# Patient Record
Sex: Female | Born: 1990 | Race: White | Hispanic: No | Marital: Single | Smoking: Former smoker
Health system: Southern US, Community
[De-identification: ages and names within clinical notes are randomized; demographics above are authoritative.]

## PROBLEM LIST (undated history)

## (undated) DIAGNOSIS — J45909 Unspecified asthma, uncomplicated: Secondary | ICD-10-CM

## (undated) DIAGNOSIS — F419 Anxiety disorder, unspecified: Secondary | ICD-10-CM

## (undated) DIAGNOSIS — E039 Hypothyroidism, unspecified: Secondary | ICD-10-CM

## (undated) DIAGNOSIS — Z789 Other specified health status: Secondary | ICD-10-CM

## (undated) DIAGNOSIS — Z8709 Personal history of other diseases of the respiratory system: Secondary | ICD-10-CM

## (undated) HISTORY — PX: OTHER SURGICAL HISTORY: SHX169

## (undated) HISTORY — DX: Personal history of other diseases of the respiratory system: Z87.09

## (undated) HISTORY — DX: Unspecified asthma, uncomplicated: J45.909

## (undated) HISTORY — DX: Anxiety disorder, unspecified: F41.9

## (undated) HISTORY — PX: TONSILLECTOMY: SUR1361

---
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
## 2013-01-10 ENCOUNTER — Emergency Department: Payer: Self-pay | Admitting: Emergency Medicine

## 2013-09-28 ENCOUNTER — Emergency Department: Payer: Self-pay

## 2015-04-05 ENCOUNTER — Emergency Department: Payer: Medicaid Other

## 2015-04-05 ENCOUNTER — Encounter: Payer: Self-pay | Admitting: Emergency Medicine

## 2015-04-05 ENCOUNTER — Emergency Department
Admission: EM | Admit: 2015-04-05 | Discharge: 2015-04-05 | Disposition: A | Discharge status: Home / Self Care | Payer: Medicaid Other | Attending: Emergency Medicine | Admitting: Emergency Medicine

## 2015-04-05 DIAGNOSIS — O209 Hemorrhage in early pregnancy, unspecified: Secondary | ICD-10-CM

## 2015-04-05 DIAGNOSIS — Z3A08 8 weeks gestation of pregnancy: Secondary | ICD-10-CM | POA: Diagnosis not present

## 2015-04-05 DIAGNOSIS — N939 Abnormal uterine and vaginal bleeding, unspecified: Secondary | ICD-10-CM

## 2015-04-05 DIAGNOSIS — Z87891 Personal history of nicotine dependence: Secondary | ICD-10-CM | POA: Diagnosis not present

## 2015-04-05 LAB — CBC WITH DIFFERENTIAL/PLATELET
BASOS PCT: 0 %
Basophils Absolute: 0 10*3/uL (ref 0–0.1)
EOS ABS: 0.1 10*3/uL (ref 0–0.7)
Eosinophils Relative: 1 %
HEMATOCRIT: 43.5 % (ref 35.0–47.0)
HEMOGLOBIN: 14.2 g/dL (ref 12.0–16.0)
Lymphocytes Relative: 25 %
Lymphs Abs: 2 10*3/uL (ref 1.0–3.6)
MCH: 28.9 pg (ref 26.0–34.0)
MCHC: 32.6 g/dL (ref 32.0–36.0)
MCV: 88.6 fL (ref 80.0–100.0)
MONO ABS: 0.5 10*3/uL (ref 0.2–0.9)
MONOS PCT: 7 %
Neutro Abs: 5.1 10*3/uL (ref 1.4–6.5)
Neutrophils Relative %: 67 %
Platelets: 190 10*3/uL (ref 150–440)
RBC: 4.91 MIL/uL (ref 3.80–5.20)
RDW: 12.8 % (ref 11.5–14.5)
WBC: 7.7 10*3/uL (ref 3.6–11.0)

## 2015-04-05 LAB — COMPREHENSIVE METABOLIC PANEL
ALK PHOS: 90 U/L (ref 38–126)
ALT: 9 U/L — AB (ref 14–54)
ANION GAP: 5 (ref 5–15)
AST: 16 U/L (ref 15–41)
Albumin: 4.2 g/dL (ref 3.5–5.0)
BUN: 7 mg/dL (ref 6–20)
CHLORIDE: 108 mmol/L (ref 101–111)
CO2: 24 mmol/L (ref 22–32)
Calcium: 9.4 mg/dL (ref 8.9–10.3)
Creatinine, Ser: 0.68 mg/dL (ref 0.44–1.00)
GFR calc Af Amer: >60 mL/min (ref >60)
GFR calc non Af Amer: >60 mL/min (ref >60)
Glucose, Bld: 92 mg/dL (ref 65–99)
POTASSIUM: 3.7 mmol/L (ref 3.5–5.1)
Sodium: 137 mmol/L (ref 135–145)
Total Bilirubin: 0.6 mg/dL (ref 0.3–1.2)
Total Protein: 7 g/dL (ref 6.5–8.1)

## 2015-04-05 LAB — ABO/RH
ABO/RH(D): B POS
ABO/RH(D): B POS

## 2015-04-05 LAB — HCG, QUANTITATIVE, PREGNANCY: HCG, BETA CHAIN, QUANT, S: 30 m[IU]/mL — AB (ref <5)

## 2015-04-05 NOTE — ED Notes (Signed)
Patient transported to Ultrasound 

## 2015-04-05 NOTE — ED Provider Notes (Signed)
[facility] Emergency Department Provider Note  ____________________________________________  Time seen: 3:50 PM  I have reviewed the triage vital signs and the nursing notes.   HISTORY  Chief Complaint Vaginal Bleeding    HPI Joan Moreno is a 24 y.o. female who reports being [redacted] weeks pregnant with her first pregnancy and seeing some vaginal bleeding today. She notes that the bleeding is a little bit heavier than spotting but not quite as heavy as a regular period. No chest pain shortness of breath abdominal pain nausea vomiting diarrhea headaches vision changes or dizziness. No abnormal vaginal discharge or fever. She notes mild pelvic cramping  She recently went to the [facility] health Department to establish prenatal care and is taking vitamins.   History reviewed. No pertinent past medical history.  There are no active problems to display for this patient.   Past Surgical History  Procedure Laterality Date  . Tonsillectomy      No current outpatient prescriptions on file.  Allergies Sulfa antibiotics  No family history on file.  Social History History  Substance Use Topics  . Smoking status: Former Games developer  . Smokeless tobacco: Not on file  . Alcohol Use: No    Review of Systems  Constitutional: No fever or chills. No weight changes Eyes:No blurry vision or double vision.  ENT: No sore throat. Cardiovascular: No chest pain. Respiratory: No dyspnea or cough. Gastrointestinal: Negative for abdominal pain, vomiting and diarrhea.  No BRBPR or melena. Genitourinary: Vaginal bleeding as above  Musculoskeletal: Negative for back pain. No joint swelling or pain. Skin: Negative for rash. Neurological: Negative for headaches, focal weakness or numbness. Psychiatric:No anxiety or depression.   Endocrine:No hot/cold intolerance, changes in energy, or sleep difficulty.  10-point ROS otherwise  negative.  ____________________________________________   PHYSICAL EXAM:  VITAL SIGNS: ED Triage Vitals  Enc Vitals Group     BP 04/05/15 1306 124/82 mmHg     Pulse Rate 04/05/15 1306 100     Resp 04/05/15 1306 20     Temp 04/05/15 1306 98.3 F (36.8 C)     Temp Source 04/05/15 1306 Oral     SpO2 04/05/15 1306 98 %     Weight 04/05/15 1306 120 lb (54.432 kg)     Height 04/05/15 1306 5\' 5"  (1.651 m)     Head Cir --      Peak Flow --      Pain Score 04/05/15 1308 2     Pain Loc --      Pain Edu? --      Excl. in GC? --      Constitutional: Alert and oriented. Well appearing and in no distress. Eyes: No scleral icterus. No conjunctival pallor. PERRL. EOMI ENT   Head: Normocephalic and atraumatic.   Nose: No congestion/rhinnorhea. No septal hematoma   Mouth/Throat: MMM, no pharyngeal erythema. No peritonsillar mass. No uvula shift.   Neck: No stridor. No SubQ emphysema. No meningismus. Hematological/Lymphatic/Immunilogical: No cervical lymphadenopathy. Cardiovascular: RRR. Normal and symmetric distal pulses are present in all extremities. No murmurs, rubs, or gallops. Respiratory: Normal respiratory effort without tachypnea nor retractions. Breath sounds are clear and equal bilaterally. No wheezes/rales/rhonchi. Gastrointestinal: Soft and nontender. No distention. There is no CVA tenderness.  No rebound, rigidity, or guarding. Genitourinary: deferred Musculoskeletal: Nontender with normal range of motion in all extremities. No joint effusions.  No lower extremity tenderness.  No edema. Neurologic:   Normal speech and language.  CN 2-10 normal. Motor grossly  intact. No pronator drift.  Normal gait. No gross focal neurologic deficits are appreciated.  Skin:  Skin is warm, dry and intact. No rash noted.  No petechiae, purpura, or bullae. Psychiatric: Mood and affect are normal. Speech and behavior are normal. Patient exhibits appropriate insight and  judgment.  ____________________________________________    LABS (pertinent positives/negatives) (all labs ordered are listed, but only abnormal results are displayed) Labs Reviewed  COMPREHENSIVE METABOLIC PANEL - Abnormal; Notable for the following:    ALT 9 (*)    All other components within normal limits  HCG, QUANTITATIVE, PREGNANCY - Abnormal; Notable for the following:    hCG, Beta Chain, Quant, S 30 (*)    All other components within normal limits  CBC WITH DIFFERENTIAL/PLATELET  ABO/RH  ABO/RH   ____________________________________________   EKG    ____________________________________________    RADIOLOGY  Pelvic ultrasound unable to visualize pregnancy inside the uterus or any ectopic location, otherwise unremarkable.  ____________________________________________   PROCEDURES  ____________________________________________   INITIAL IMPRESSION / ASSESSMENT AND PLAN / ED COURSE  Pertinent labs & imaging results that were available during my care of the patient were reviewed by me and considered in my medical decision making (see chart for details).  Ultrasound unable to confirm location of pregnancy. HCG is 30, so this may be because it is too early or because she has completed a miscarriage. We'll have her follow-up with the health Department for continued monitoring of her serum hCG and ultrasound. She'll return if she has any worsening symptoms including chest pain shortness of breath worsened bleeding or abdominal pain. Rh+, so no RhoGAM  ____________________________________________   FINAL CLINICAL IMPRESSION(S) / ED DIAGNOSES  Final diagnoses:  Vagina bleeding  Vaginal bleeding in pregnancy, first trimester      Sharman Cheek, MD 04/05/15 [phone]

## 2015-04-05 NOTE — ED Notes (Signed)
Has not had ultrasound for this pregnancy yet

## 2015-04-05 NOTE — Discharge Instructions (Signed)
Vaginal Bleeding During Pregnancy, First Trimester A small amount of bleeding (spotting) from the vagina is relatively common in early pregnancy. It usually stops on its own. Various things may cause bleeding or spotting in early pregnancy. Some bleeding may be related to the pregnancy, and some may not. In most cases, the bleeding is normal and is not a problem. However, bleeding can also be a sign of something serious. Be sure to tell your health care provider about any vaginal bleeding right away. Some possible causes of vaginal bleeding during the first trimester include:  Infection or inflammation of the cervix.  Growths (polyps) on the cervix.  Miscarriage or threatened miscarriage.  Pregnancy tissue has developed outside of the uterus and in a fallopian tube (tubal pregnancy).  Tiny cysts have developed in the uterus instead of pregnancy tissue (molar pregnancy). HOME CARE INSTRUCTIONS  Watch your condition for any changes. The following actions may help to lessen any discomfort you are feeling:  Follow your health care provider's instructions for limiting your activity. If your health care provider orders bed rest, you may need to stay in bed and only get up to use the bathroom. However, your health care provider may allow you to continue light activity.  If needed, make plans for someone to help with your regular activities and responsibilities while you are on bed rest.  Keep track of the number of pads you use each day, how often you change pads, and how soaked (saturated) they are. Write this down.  Do not use tampons. Do not douche.  Do not have sexual intercourse or orgasms until approved by your health care provider.  If you pass any tissue from your vagina, save the tissue so you can show it to your health care provider.  Only take over-the-counter or prescription medicines as directed by your health care provider.  Do not take aspirin because it can make you  bleed.  Keep all follow-up appointments as directed by your health care provider. SEEK MEDICAL CARE IF:  You have any vaginal bleeding during any part of your pregnancy.  You have cramps or labor pains.  You have a fever, not controlled by medicine. SEEK IMMEDIATE MEDICAL CARE IF:   You have severe cramps in your back or belly (abdomen).  You pass large clots or tissue from your vagina.  Your bleeding increases.  You feel light-headed or weak, or you have fainting episodes.  You have chills.  You are leaking fluid or have a gush of fluid from your vagina.  You pass out while having a bowel movement. MAKE SURE YOU:  Understand these instructions.  Will watch your condition.  Will get help right away if you are not doing well or get worse. Document Released: 07/14/2005 Document Revised: 10/09/2013 Document Reviewed: 06/11/2013 [facility] Patient Information 2015 [place], [place]. This information is not intended to replace advice given to you by your health care provider. Make sure you discuss any questions you have with your health care provider.   Your beta hCG level (pregnancy hormone) level today is 30. Your ultrasound was unable to visualize any pregnancy inside the uterus or in any abnormal location. This may be due to a miscarriage that has already happened, or the pregnancy being too early and too small to be able to see. Continue following up with the health department for continued monitoring of your symptoms and further blood work and ultrasound as needed to verify the location of your pregnancy and ensure that it is  not in a dangerous location

## 2015-05-31 ENCOUNTER — Emergency Department
Admission: EM | Admit: 2015-05-31 | Discharge: 2015-05-31 | Disposition: A | Discharge status: Home / Self Care | Payer: Medicaid Other | Attending: Emergency Medicine | Admitting: Emergency Medicine

## 2015-05-31 ENCOUNTER — Encounter: Payer: Self-pay | Admitting: Emergency Medicine

## 2015-05-31 DIAGNOSIS — N939 Abnormal uterine and vaginal bleeding, unspecified: Secondary | ICD-10-CM | POA: Insufficient documentation

## 2015-05-31 DIAGNOSIS — Z87891 Personal history of nicotine dependence: Secondary | ICD-10-CM | POA: Insufficient documentation

## 2015-05-31 LAB — HCG, QUANTITATIVE, PREGNANCY

## 2015-05-31 LAB — ABO/RH: ABO/RH(D): B POS

## 2015-05-31 NOTE — Discharge Instructions (Signed)
Your pregnancy test today was negative. Although I suspect you likely had a miscarriage when you had bleeding 3 or 4 weeks ago, I do recommend he follow up with the health department Monday or Tuesday for reevaluation confirmation. Return to the emergency room right away if you develop heavy bleeding, a fever, abdominal pain, or other new concerns or symptoms arise.  Abnormal Uterine Bleeding Abnormal uterine bleeding can affect women at various stages in life, including teenagers, women in their reproductive years, pregnant women, and women who have reached menopause. Several kinds of uterine bleeding are considered abnormal, including:  Bleeding or spotting between periods.   Bleeding after sexual intercourse.   Bleeding that is heavier or more than normal.   Periods that last longer than usual.  Bleeding after menopause.  Many cases of abnormal uterine bleeding are minor and simple to treat, while others are more serious. Any type of abnormal bleeding should be evaluated by your health care provider. Treatment will depend on the cause of the bleeding. HOME CARE INSTRUCTIONS Monitor your condition for any changes. The following actions may help to alleviate any discomfort you are experiencing:  Avoid the use of tampons and douches as directed by your health care provider.  Change your pads frequently. You should get regular pelvic exams and Pap tests. Keep all follow-up appointments for diagnostic tests as directed by your health care provider.  SEEK MEDICAL CARE IF:   Your bleeding lasts more than 1 week.   You feel dizzy at times.  SEEK IMMEDIATE MEDICAL CARE IF:   You pass out.   You are changing pads every 15 to 30 minutes.   You have abdominal pain.  You have a fever.   You become sweaty or weak.   You are passing large blood clots from the vagina.   You start to feel nauseous and vomit. MAKE SURE YOU:   Understand these instructions.  Will watch your  condition.  Will get help right away if you are not doing well or get worse. Document Released: 10/04/2005 Document Revised: 10/09/2013 Document Reviewed: 05/03/2013 [facility] Patient Information 2015 [place], [place]. This information is not intended to replace advice given to you by your health care provider. Make sure you discuss any questions you have with your health care provider.

## 2015-05-31 NOTE — ED Provider Notes (Signed)
[facility] Emergency Department Provider Note  ____________________________________________  Time seen: Approximately 3:56 PM  I have reviewed the triage vital signs and the nursing notes.   HISTORY  Chief Complaint Vaginal Bleeding    HPI Joan Moreno is a 24 y.o. female denies any medical problems. She is a little unclear on dates, but believes he is supposed to be roughly 9-[redacted] weeks pregnant. She's been following at the health department for an early pregnancy. She reports to me that she started having some slight spotting this morning. Been going through about 1 pad every hour. She is not having pain.  She reports no previous pregnancies. No fevers chills nausea vomiting or abdominal pain.  Interestingly, the patient reports that about 3 or 4 weeks ago she had a few days of spotting and was seen at the health department and was told that everything seemed okay based on her pregnancy at that time. And when we further discussed, she tells me that she was suspicious she may have had a slight miscarriage at that time.   History reviewed. No pertinent past medical history.  There are no active problems to display for this patient.   Past Surgical History  Procedure Laterality Date  . Tonsillectomy      No current outpatient prescriptions on file.  Allergies Sulfa antibiotics  No family history on file.  Social History Social History  Substance Use Topics  . Smoking status: Former Games developer  . Smokeless tobacco: Never Used  . Alcohol Use: No    Review of Systems Constitutional: No fever/chills Eyes: No visual changes. ENT: No sore throat. Cardiovascular: Denies chest pain. Respiratory: Denies shortness of breath. Gastrointestinal: No abdominal pain.  No nausea, no vomiting.  No diarrhea.  No constipation. Genitourinary: Negative for dysuria. No back pain. No vaginal discharge and some slight spotting starting earlier  today. Musculoskeletal: Negative for back pain. Skin: Negative for rash. Neurological: Negative for headaches, focal weakness or numbness.  10-point ROS otherwise negative.  ____________________________________________   PHYSICAL EXAM:  VITAL SIGNS: ED Triage Vitals  Enc Vitals Group     BP 05/31/15 1312 113/71 mmHg     Pulse Rate 05/31/15 1312 118     Resp 05/31/15 1312 20     Temp 05/31/15 1312 98.6 F (37 C)     Temp Source 05/31/15 1312 Oral     SpO2 05/31/15 1312 97 %     Weight 05/31/15 1312 115 lb (52.164 kg)     Height 05/31/15 1312 5\' 6"  (1.676 m)     Head Cir --      Peak Flow --      Pain Score --      Pain Loc --      Pain Edu? --      Excl. in GC? --     Constitutional: Alert and oriented. Well appearing and in no acute distress. Eyes: Conjunctivae are normal. PERRL. EOMI. Head: Atraumatic. Nose: No congestion/rhinnorhea. Mouth/Throat: Mucous membranes are moist.  Oropharynx non-erythematous. Neck: No stridor.   Cardiovascular: Normal rate, regular rhythm. Grossly normal heart sounds.  Good peripheral circulation. Respiratory: Normal respiratory effort.  No retractions. Lungs CTAB. Gastrointestinal: Soft and nontender. No distention. No abdominal bruits. No CVA tenderness. Musculoskeletal: No lower extremity tenderness nor edema.  No joint effusions. Neurologic:  Normal speech and language. No gross focal neurologic deficits are appreciated. Skin:  Skin is warm, dry and intact. No rash noted. Psychiatric: Mood and affect are normal. Speech and  behavior are normal.  ____________________________________________   LABS (all labs ordered are listed, but only abnormal results are displayed)  Labs Reviewed  HCG, QUANTITATIVE, PREGNANCY  ABO/RH   ____________________________________________  EKG   ____________________________________________  RADIOLOGY   ____________________________________________   PROCEDURES  Procedure(s) performed:  None  Critical Care performed: No  ____________________________________________   INITIAL IMPRESSION / ASSESSMENT AND PLAN / ED COURSE  Pertinent labs & imaging results that were available during my care of the patient were reviewed by me and considered in my medical decision making (see chart for details).  Vaginal spotting without pain. Previously thought to be pregnant, but hCG is negative today. In the setting of the clinical history she gives, seems very suspicious that she would've had a miscarriage roughly 3-4 weeks ago when she had an episode of spotting. She has no pain, her hCG is again negative indicating she is no longer pregnant. She is in no distress and stable vital signs. I discussed need to check Rh, and she is agreeable with this and she does not know her blood type.  I suspect that she likely has return of her menses occurring at this time, given she is no longer pregnant. No signs or symptoms of pelvic infection.  Really no indication for further workup at this time, and we'll refer her back to health department for further follow-up Monday or Tuesday of this week. Close return precautions advised.  Rh is positive. ____________________________________________   FINAL CLINICAL IMPRESSION(S) / ED DIAGNOSES  Final diagnoses:  Vaginal bleeding problems      Sharyn Creamer, MD 05/31/15 1845

## 2015-05-31 NOTE — ED Notes (Signed)
Spoke with lab and verified they had the blood tube for and abo/rh. They confirmed and will run the results

## 2015-05-31 NOTE — ED Notes (Signed)
Pt reports being [redacted] weeks pregnant with vaginal bleeding. That started this morning. Pt reports 1-2 bloody pads per hour. Denies pain.

## 2015-06-26 ENCOUNTER — Encounter: Payer: Self-pay | Admitting: Emergency Medicine

## 2015-06-26 ENCOUNTER — Emergency Department
Admission: EM | Admit: 2015-06-26 | Discharge: 2015-06-26 | Disposition: A | Discharge status: Home / Self Care | Payer: Medicaid Other | Attending: Emergency Medicine | Admitting: Emergency Medicine

## 2015-06-26 DIAGNOSIS — H9202 Otalgia, left ear: Secondary | ICD-10-CM | POA: Diagnosis present

## 2015-06-26 DIAGNOSIS — Z87891 Personal history of nicotine dependence: Secondary | ICD-10-CM | POA: Diagnosis not present

## 2015-06-26 DIAGNOSIS — R42 Dizziness and giddiness: Secondary | ICD-10-CM | POA: Diagnosis not present

## 2015-06-26 DIAGNOSIS — M26609 Unspecified temporomandibular joint disorder, unspecified side: Secondary | ICD-10-CM

## 2015-06-26 DIAGNOSIS — M2662 Arthralgia of temporomandibular joint: Secondary | ICD-10-CM | POA: Insufficient documentation

## 2015-06-26 MED ORDER — MECLIZINE HCL 32 MG PO TABS: 32.0000 mg | ORAL_TABLET | Freq: Three times a day (TID) | ORAL | Status: DC | PRN

## 2015-06-26 MED ORDER — IBUPROFEN 800 MG PO TABS: 800.0000 mg | ORAL_TABLET | Freq: Three times a day (TID) | ORAL | Status: DC | PRN

## 2015-06-26 MED ORDER — BACLOFEN 10 MG PO TABS: 10.0000 mg | ORAL_TABLET | Freq: Three times a day (TID) | ORAL | Status: DC

## 2015-06-26 NOTE — Discharge Instructions (Signed)
Dizziness  Dizziness means you feel unsteady or lightheaded. You might feel like you are going to pass out (faint). HOME CARE   Drink enough fluids to keep your pee (urine) clear or pale yellow.  Take your medicines exactly as told by your doctor. If you take blood pressure medicine, always stand up slowly from the lying or sitting position. Hold on to something to steady yourself.  If you need to stand in one place for a long time, move your legs often. Tighten and relax your leg muscles.  Have someone stay with you until you feel okay.  Do not drive or use heavy machinery if you feel dizzy.  Do not drink alcohol. GET HELP RIGHT AWAY IF:   You feel dizzy or lightheaded and it gets worse.  You feel sick to your stomach (nauseous), or you throw up (vomit).  You have trouble talking or walking.  You feel weak or have trouble using your arms, hands, or legs.  You cannot think clearly or have trouble forming sentences.  You have chest pain, belly (abdominal) pain, sweating, or you are short of breath.  Your vision changes.  You are bleeding.  You have problems from your medicine that seem to be getting worse. MAKE SURE YOU:   Understand these instructions.  Will watch your condition.  Will get help right away if you are not doing well or get worse. Document Released: 09/23/2011 Document Revised: 12/27/2011 Document Reviewed: 09/23/2011 [facility] Patient Information 2015 [place], [place]. This information is not intended to replace advice given to you by your health care provider. Make sure you discuss any questions you have with your health care provider.  Temporomandibular Problems  Temporomandibular joint (TMJ) dysfunction means there are problems with the joint between your jaw and your skull. This is a joint lined by cartilage like other joints in your body but also has a small disc in the joint which keeps the bones from rubbing on each other. These joints are like other  joints and can get inflamed (sore) from arthritis and other problems. When this joint gets sore, it can cause headaches and pain in the jaw and the face. CAUSES  Usually the arthritic types of problems are caused by soreness in the joint. Soreness in the joint can also be caused by overuse. This may come from grinding your teeth. It may also come from mis-alignment in the joint. DIAGNOSIS Diagnosis of this condition can often be made by history and exam. Sometimes your caregiver may need X-rays or an MRI scan to determine the exact cause. It may be necessary to see your dentist to determine if your teeth and jaws are lined up correctly. TREATMENT  Most of the time this problem is not serious; however, sometimes it can persist (become chronic). When this happens medications that will cut down on inflammation (soreness) help. Sometimes a shot of cortisone into the joint will be helpful. If your teeth are not aligned it may help for your dentist to make a splint for your mouth that can help this problem. If no physical problems can be found, the problem may come from tension. If tension is found to be the cause, biofeedback or relaxation techniques may be helpful. HOME CARE INSTRUCTIONS   Later in the day, applications of ice packs may be helpful. Ice can be used in a plastic bag with a towel around it to prevent frostbite to skin. This may be used about every 2 hours for 20 to 30 minutes,  as needed while awake, or as directed by your caregiver.  Only take over-the-counter or prescription medicines for pain, discomfort, or fever as directed by your caregiver.  If physical therapy was prescribed, follow your caregiver's directions.  Wear mouth appliances as directed if they were given. Document Released: 06/29/2001 Document Revised: 12/27/2011 Document Reviewed: 10/06/2008 [facility] Patient Information 2015 [place], [place]. This information is not intended to replace advice given to you by your health  care provider. Make sure you discuss any questions you have with your health care provider.  Vertigo Vertigo means you feel like you or your surroundings are moving when they are not. Vertigo can be dangerous if it occurs when you are at work, driving, or performing difficult activities.  CAUSES  Vertigo occurs when there is a conflict of signals sent to your brain from the visual and sensory systems in your body. There are many different causes of vertigo, including:  Infections, especially in the inner ear.  A bad reaction to a drug or misuse of alcohol and medicines.  Withdrawal from drugs or alcohol.  Rapidly changing positions, such as lying down or rolling over in bed.  A migraine headache.  Decreased blood flow to the brain.  Increased pressure in the brain from a head injury, infection, tumor, or bleeding. SYMPTOMS  You may feel as though the world is spinning around or you are falling to the ground. Because your balance is upset, vertigo can cause nausea and vomiting. You may have involuntary eye movements (nystagmus). DIAGNOSIS  Vertigo is usually diagnosed by physical exam. If the cause of your vertigo is unknown, your caregiver may perform imaging tests, such as an MRI scan (magnetic resonance imaging). TREATMENT  Most cases of vertigo resolve on their own, without treatment. Depending on the cause, your caregiver may prescribe certain medicines. If your vertigo is related to body position issues, your caregiver may recommend movements or procedures to correct the problem. In rare cases, if your vertigo is caused by certain inner ear problems, you may need surgery. HOME CARE INSTRUCTIONS   Follow your caregiver's instructions.  Avoid driving.  Avoid operating heavy machinery.  Avoid performing any tasks that would be dangerous to you or others during a vertigo episode.  Tell your caregiver if you notice that certain medicines seem to be causing your vertigo. Some of  the medicines used to treat vertigo episodes can actually make them worse in some people. SEEK IMMEDIATE MEDICAL CARE IF:   Your medicines do not relieve your vertigo or are making it worse.  You develop problems with talking, walking, weakness, or using your arms, hands, or legs.  You develop severe headaches.  Your nausea or vomiting continues or gets worse.  You develop visual changes.  A family member notices behavioral changes.  Your condition gets worse. MAKE SURE YOU:  Understand these instructions.  Will watch your condition.  Will get help right away if you are not doing well or get worse. Document Released: 07/14/2005 Document Revised: 12/27/2011 Document Reviewed: 04/22/2011 [facility] Patient Information 2015 [place], [place]. This information is not intended to replace advice given to you by your health care provider. Make sure you discuss any questions you have with your health care provider.

## 2015-06-26 NOTE — ED Provider Notes (Signed)
[facility] Emergency Department Provider Note  ____________________________________________  Time seen: Approximately 6:17 PM  I have reviewed the triage vital signs and the nursing notes.   HISTORY  Chief Complaint Otalgia    HPI Joan Moreno is a 24 y.o. female is for evaluation of left jaw and ear pain. Patient also states that she's been feeling dizzy and lightheaded of various times throughout the day.   History reviewed. No pertinent past medical history.  There are no active problems to display for this patient.   Past Surgical History  Procedure Laterality Date  . Tonsillectomy      Current Outpatient Rx  Name  Route  Sig  Dispense  Refill  . baclofen (LIORESAL) 10 MG tablet   Oral   Take 1 tablet (10 mg total) by mouth 3 (three) times daily.   30 tablet   0   . ibuprofen (ADVIL,MOTRIN) 800 MG tablet   Oral   Take 1 tablet (800 mg total) by mouth every 8 (eight) hours as needed.   30 tablet   0   . meclizine (ANTIVERT) 32 MG tablet   Oral   Take 1 tablet (32 mg total) by mouth 3 (three) times daily as needed.   30 tablet   0     Allergies Sulfa antibiotics  History reviewed. No pertinent family history.  Social History Social History  Substance Use Topics  . Smoking status: Former Games developer  . Smokeless tobacco: Never Used  . Alcohol Use: No    Review of Systems Constitutional: No fever/chills, positive for occasional dizziness Eyes: No visual changes. ENT: No sore throat. As of left ear pain. Positive left jaw pain. Cardiovascular: Denies chest pain. Respiratory: Denies shortness of breath. Gastrointestinal: No abdominal pain.  No nausea, no vomiting.  No diarrhea.  No constipation. Genitourinary: Negative for dysuria. Musculoskeletal: Negative for back pain. Skin: Negative for rash. Neurological: Negative for headaches, focal weakness or numbness.  10-point ROS otherwise  negative.  ____________________________________________   PHYSICAL EXAM:  VITAL SIGNS: ED Triage Vitals  Enc Vitals Group     BP 06/26/15 1744 124/75 mmHg     Pulse Rate 06/26/15 1744 114     Resp 06/26/15 1744 20     Temp 06/26/15 1744 98.7 F (37.1 C)     Temp Source 06/26/15 1744 Oral     SpO2 06/26/15 1744 100 %     Weight 06/26/15 1744 115 lb (52.164 kg)     Height 06/26/15 1744 5\' 6"  (1.676 m)     Head Cir --      Peak Flow --      Pain Score --      Pain Loc --      Pain Edu? --      Excl. in GC? --     Constitutional: Alert and oriented. Well appearing and in no acute distress. Eyes: Conjunctivae are normal. PERRL. EOMI. Head: Atraumatic. Nose: No congestion/rhinnorhea. Mouth/Throat: Mucous membranes are moist.  Oropharynx non-erythematous. Positive for left temporal mandibular tenderness. Neck: No stridor.   Cardiovascular: Normal rate, regular rhythm. Grossly normal heart sounds.  Good peripheral circulation. Respiratory: Normal respiratory effort.  No retractions. Lungs CTAB. Musculoskeletal: No lower extremity tenderness nor edema.  No joint effusions. Neurologic:  Normal speech and language. No gross focal neurologic deficits are appreciated. No gait instability. Skin:  Skin is warm, dry and intact. No rash noted. Psychiatric: Mood and affect are normal. Speech and behavior are normal.  ____________________________________________   LABS (all labs ordered are listed, but only abnormal results are displayed)  Labs Reviewed - No data to display ____________________________________________  PROCEDURES  Procedure(s) performed: None  Critical Care performed: No  ____________________________________________   INITIAL IMPRESSION / ASSESSMENT AND PLAN / ED COURSE  Pertinent labs & imaging results that were available during my care of the patient were reviewed by me and considered in my medical decision making (see chart for details).  Vertigo and  some TMJ. Rx given for meclizine 25 mg every 6 hours as needed for dizziness and Motrin 800 mg 3 times a day as needed for inflammation and jaw pain. Baclofen 10 mg 3 times a day as needed to relax muscles. Patient voices no other emergency medical complaints at this time and will return to the ER with any new symptomology. ____________________________________________   FINAL CLINICAL IMPRESSION(S) / ED DIAGNOSES  Final diagnoses:  Vertigo  TMJ (temporomandibular joint syndrome)   Evangeline Dakin Beers, PA-C 06/26/15 1836  Darien Ramus, MD 06/26/15 2348

## 2015-06-26 NOTE — ED Notes (Signed)
Patient to ED with report of left earache for about a day and a half. Patient reports some mild lightheadedness with symptoms as well.

## 2015-09-02 ENCOUNTER — Ambulatory Visit (INDEPENDENT_AMBULATORY_CARE_PROVIDER_SITE_OTHER): Payer: Medicaid Other | Admitting: Obstetrics and Gynecology

## 2015-09-02 ENCOUNTER — Ambulatory Visit: Payer: Medicaid Other

## 2015-09-02 VITALS — BP 118/77 | HR 86 | Wt 119.2 lb

## 2015-09-02 DIAGNOSIS — Z3687 Encounter for antenatal screening for uncertain dates: Secondary | ICD-10-CM

## 2015-09-02 DIAGNOSIS — Z3490 Encounter for supervision of normal pregnancy, unspecified, unspecified trimester: Secondary | ICD-10-CM

## 2015-09-02 DIAGNOSIS — Z36 Encounter for antenatal screening of mother: Secondary | ICD-10-CM

## 2015-09-02 DIAGNOSIS — Z331 Pregnant state, incidental: Secondary | ICD-10-CM | POA: Diagnosis not present

## 2015-09-02 DIAGNOSIS — Z113 Encounter for screening for infections with a predominantly sexual mode of transmission: Secondary | ICD-10-CM

## 2015-09-02 NOTE — Patient Instructions (Signed)

## 2015-09-02 NOTE — Progress Notes (Signed)
Joan Moreno presents for NOB nurse interview visit. G-2.  P-0010. Positive pregnancy test at [facility] 08/26/2015. Pregnancy eduction material explained and given. No cats in the home. NOB labs ordered. HIV labs and Drug screen were explained optional and she could opt out of tests but did not decline. Drug screen ordered and sent to lab. PNV encouraged. NT to discuss with provider. Pt. To follow up with provider in 5 weeks for NOB physical.  Ultrasound done today for viability and dating: EDD: 04/21/2016; EGA: 6w 6d. LMP: 06/07/2015, pt is unsure. All questions answered.   ZIKA EXPOSURE SCREEN:  The patient has not traveled to a [place] endemic area within the past 6 months, nor has she had unprotected sex with a partner who has travelled to a [place] endemic region within the past 6 months. The patient has been advised to notify us if these factors change any time during this current pregnancy, so adequate testing and monitoring can be initiated.

## 2015-09-03 ENCOUNTER — Other Ambulatory Visit: Payer: Self-pay | Admitting: Obstetrics and Gynecology

## 2015-09-03 DIAGNOSIS — O09899 Supervision of other high risk pregnancies, unspecified trimester: Secondary | ICD-10-CM | POA: Insufficient documentation

## 2015-09-03 DIAGNOSIS — O9989 Other specified diseases and conditions complicating pregnancy, childbirth and the puerperium: Principal | ICD-10-CM

## 2015-09-03 DIAGNOSIS — Z2839 Other underimmunization status: Secondary | ICD-10-CM

## 2015-09-03 DIAGNOSIS — Z283 Underimmunization status: Secondary | ICD-10-CM | POA: Insufficient documentation

## 2015-09-03 LAB — URINALYSIS, ROUTINE W REFLEX MICROSCOPIC
BILIRUBIN UA: NEGATIVE
GLUCOSE, UA: NEGATIVE
Ketones, UA: NEGATIVE
Leukocytes, UA: NEGATIVE
Nitrite, UA: NEGATIVE
Protein, UA: NEGATIVE
RBC UA: NEGATIVE
Specific Gravity, UA: 1.023 (ref 1.005–1.030)
UUROB: 0.2 mg/dL (ref 0.2–1.0)
pH, UA: 6.5 (ref 5.0–7.5)

## 2015-09-03 LAB — PAIN MGT SCRN (14 DRUGS), UR
AMPHETAMINE SCRN UR: NEGATIVE ng/mL
BARBITURATE SCRN UR: NEGATIVE ng/mL
Benzodiazepine Screen, Urine: NEGATIVE ng/mL
Buprenorphine, Urine: NEGATIVE ng/mL
CANNABINOIDS UR QL SCN: NEGATIVE ng/mL
COCAINE(METAB.) SCREEN, URINE: NEGATIVE ng/mL
Creatinine(Crt), U: 127.6 mg/dL (ref 20.0–300.0)
FENTANYL, URINE: NEGATIVE pg/mL
Meperidine Screen, Urine: NEGATIVE ng/mL
Methadone Scn, Ur: NEGATIVE ng/mL
Opiate Scrn, Ur: NEGATIVE ng/mL
Oxycodone+Oxymorphone Ur Ql Scn: NEGATIVE ng/mL
PCP Scrn, Ur: NEGATIVE ng/mL
PH UR, DRUG SCRN: 6.1 (ref 4.5–8.9)
Propoxyphene, Screen: NEGATIVE ng/mL
Tramadol Ur Ql Scn: NEGATIVE ng/mL

## 2015-09-03 LAB — CBC WITH DIFFERENTIAL/PLATELET
Basophils Absolute: 0 10*3/uL (ref 0.0–0.2)
Basos: 1 %
EOS (ABSOLUTE): 0.1 10*3/uL (ref 0.0–0.4)
EOS: 1 %
HEMOGLOBIN: 13.4 g/dL (ref 11.1–15.9)
Hematocrit: 40.8 % (ref 34.0–46.6)
IMMATURE GRANS (ABS): 0 10*3/uL (ref 0.0–0.1)
Immature Granulocytes: 0 %
Lymphocytes Absolute: 1.8 10*3/uL (ref 0.7–3.1)
Lymphs: 24 %
MCH: 28.9 pg (ref 26.6–33.0)
MCHC: 32.8 g/dL (ref 31.5–35.7)
MCV: 88 fL (ref 79–97)
MONOCYTES: 9 %
Monocytes Absolute: 0.6 10*3/uL (ref 0.1–0.9)
NEUTROS ABS: 5 10*3/uL (ref 1.4–7.0)
Neutrophils: 65 %
Platelets: 195 10*3/uL (ref 150–379)
RBC: 4.64 x10E6/uL (ref 3.77–5.28)
RDW: 13.2 % (ref 12.3–15.4)
WBC: 7.6 10*3/uL (ref 3.4–10.8)

## 2015-09-03 LAB — RPR: RPR Ser Ql: NONREACTIVE

## 2015-09-03 LAB — GC/CHLAMYDIA PROBE AMP
Chlamydia trachomatis, NAA: NEGATIVE
NEISSERIA GONORRHOEAE BY PCR: NEGATIVE

## 2015-09-03 LAB — HIV ANTIBODY (ROUTINE TESTING W REFLEX): HIV SCREEN 4TH GENERATION: NONREACTIVE

## 2015-09-03 LAB — ANTIBODY SCREEN: Antibody Screen: NEGATIVE

## 2015-09-03 LAB — RUBELLA ANTIBODY, IGM

## 2015-09-03 LAB — HEPATITIS B SURFACE ANTIGEN: Hepatitis B Surface Ag: NEGATIVE

## 2015-09-03 LAB — ABO

## 2015-09-03 LAB — VARICELLA ZOSTER ANTIBODY, IGM: Varicella IgM: <0.91 index (ref 0.00–0.90)

## 2015-09-03 LAB — RH TYPE: Rh Factor: POSITIVE

## 2015-09-04 ENCOUNTER — Other Ambulatory Visit: Payer: Self-pay | Admitting: Obstetrics and Gynecology

## 2015-09-04 ENCOUNTER — Encounter: Payer: Self-pay | Admitting: Certified Nurse Midwife

## 2015-09-04 LAB — URINE CULTURE, OB REFLEX

## 2015-09-04 LAB — CULTURE, OB URINE

## 2015-09-04 MED ORDER — AMOXICILLIN 500 MG PO CAPS: 500.0000 mg | ORAL_CAPSULE | Freq: Three times a day (TID) | ORAL | Status: DC

## 2015-09-05 ENCOUNTER — Telehealth: Payer: Self-pay | Admitting: *Deleted

## 2015-09-05 NOTE — Telephone Encounter (Signed)
-----   Message from Purcell NailsMelody N Shambley, [place] sent at 09/04/2015  4:13 PM EST ----- Please let her know she has a UTI- I sent in RX safe in pregnancy, to take all meds

## 2015-09-05 NOTE — Telephone Encounter (Signed)
Left pt detailed message about lab results and to pick up medication

## 2015-09-12 ENCOUNTER — Encounter: Payer: Self-pay | Admitting: Certified Nurse Midwife

## 2015-10-07 ENCOUNTER — Encounter: Payer: Self-pay | Admitting: Certified Nurse Midwife

## 2015-10-07 ENCOUNTER — Ambulatory Visit (INDEPENDENT_AMBULATORY_CARE_PROVIDER_SITE_OTHER): Payer: Medicaid Other | Admitting: Certified Nurse Midwife

## 2015-10-07 ENCOUNTER — Other Ambulatory Visit: Payer: Self-pay

## 2015-10-07 VITALS — BP 113/67 | HR 91 | Wt 123.2 lb

## 2015-10-07 DIAGNOSIS — Z3490 Encounter for supervision of normal pregnancy, unspecified, unspecified trimester: Secondary | ICD-10-CM

## 2015-10-07 DIAGNOSIS — Z36 Encounter for antenatal screening of mother: Secondary | ICD-10-CM

## 2015-10-07 DIAGNOSIS — Z369 Encounter for antenatal screening, unspecified: Secondary | ICD-10-CM

## 2015-10-07 DIAGNOSIS — Z1389 Encounter for screening for other disorder: Secondary | ICD-10-CM

## 2015-10-07 DIAGNOSIS — Z331 Pregnant state, incidental: Secondary | ICD-10-CM

## 2015-10-07 LAB — POCT URINALYSIS DIPSTICK
BILIRUBIN UA: NEGATIVE
Blood, UA: NEGATIVE
GLUCOSE UA: NEGATIVE
Ketones, UA: NEGATIVE
LEUKOCYTES UA: NEGATIVE
NITRITE UA: NEGATIVE
Protein, UA: NEGATIVE
Spec Grav, UA: 1.02
UROBILINOGEN UA: NEGATIVE
pH, UA: 6

## 2015-10-07 NOTE — Progress Notes (Signed)
NEW OB HISTORY AND PHYSICAL  SUBJECTIVE:       Joan Moreno is a 24 y.o. G3P0010 female, Patient's last menstrual period was 06/07/2015 (approximate)., Estimated Date of Delivery: 04/21/16, [redacted]w[redacted]d, presents today for establishment of Prenatal Care. She has no unusual complaints. She declines genetic testing.     Gynecologic History Patient's last menstrual period was 06/07/2015 (approximate). Normal Contraception: none Last Pap: never had a pap.  Obstetric History OB History  Gravida Para Term Preterm AB SAB TAB Ectopic Multiple Living  # Outcome Date GA Lbr Len/2nd Weight Sex Delivery Anes PTL Lv  2 Current           1 SAB 04/2015        FD      Past Medical History  Diagnosis Date  . Personal history of asthma     as a child    Past Surgical History  Procedure Laterality Date  . Tonsillectomy    . Broke leg      put to sleep to put back in place    Current Outpatient Prescriptions on File Prior to Visit  Medication Sig Dispense Refill  . Prenatal Vit-Fe Fumarate-FA (MULTIVITAMIN-PRENATAL) 27-0.8 MG TABS tablet Take 1 tablet by mouth daily at 12 noon.     No current facility-administered medications on file prior to visit.    Allergies  Allergen Reactions  . Sulfa Antibiotics Hives and Rash    Social History   Social History  . Marital Status: Single    Spouse Name: N/A  . Number of Children: N/A  . Years of Education: N/A   Occupational History  . unemployed    Social History Main Topics  . Smoking status: Former Games developer  . Smokeless tobacco: Never Used  . Alcohol Use: No  . Drug Use: No  . Sexual Activity:    Partners: Male   Other Topics Concern  . Not on file   Social History Narrative    Family History  Problem Relation Age of Onset  . Diabetes Maternal Grandfather     aunt, uncle  . Diabetes Mother     borderline    The following portions of the patient's history were reviewed and updated as appropriate:  allergies, current medications, past OB history, past medical history, past surgical history, past family history, past social history, and problem list.    OBJECTIVE: Initial Physical Exam (New OB)  GENERAL APPEARANCE: alert, well appearing, in no apparent distress HEAD: normocephalic, atraumatic MOUTH: mucous membranes moist, pharynx normal without lesions and dental hygiene good THYROID: no thyromegaly or masses present BREASTS: no masses noted, no significant tenderness, no palpable axillary nodes, no skin changes LUNGS: clear to auscultation, no wheezes, rales or rhonchi, symmetric air entry HEART: regular rate and rhythm, no murmurs ABDOMEN: soft, nontender, nondistended, no abnormal masses, no epigastric pain EXTREMITIES: no redness or tenderness in the calves or thighs, no edema, Homan's sign negative bilaterally SKIN: normal coloration and turgor, no rashes LYMPH NODES: no adenopathy palpable NEUROLOGIC: alert, oriented, normal speech, no focal findings or movement disorder noted, DTRs normal and symmetrical  PELVIC EXAM EXTERNAL GENITALIA: normal appearing vulva with no masses, tenderness or lesions VAGINA: no abnormal discharge or lesions CERVIX: no lesions or cervical motion tenderness UTERUS: gravid and consistent with 12 weeks ADNEXA: no masses palpable and nontender OB EXAM PELVIMETRY: appears adequate RECTUM: exam not indicated  ASSESSMENT: Normal pregnancy  PLAN:  Prenatal care See orders

## 2015-10-07 NOTE — Patient Instructions (Signed)
First Trimester of Pregnancy The first trimester of pregnancy is from week 1 until the end of week 12 (months 1 through 3). A week after a sperm fertilizes an egg, the egg will implant on the wall of the uterus. This embryo will begin to develop into a baby. Genes from you and your partner are forming the baby. The female genes determine whether the baby is a boy or a girl. At 6-8 weeks, the eyes and face are formed, and the heartbeat can be seen on ultrasound. At the end of 12 weeks, all the baby's organs are formed.  Now that you are pregnant, you will want to do everything you can to have a healthy baby. Two of the most important things are to get good prenatal care and to follow your health care provider's instructions. Prenatal care is all the medical care you receive before the baby's birth. This care will help prevent, find, and treat any problems during the pregnancy and childbirth. BODY CHANGES Your body goes through many changes during pregnancy. The changes vary from woman to woman.   You may gain or lose a couple of pounds at first.  You may feel sick to your stomach (nauseous) and throw up (vomit). If the vomiting is uncontrollable, call your health care provider.  You may tire easily.  You may develop headaches that can be relieved by medicines approved by your health care provider.  You may urinate more often. Painful urination may mean you have a bladder infection.  You may develop heartburn as a result of your pregnancy.  You may develop constipation because certain hormones are causing the muscles that push waste through your intestines to slow down.  You may develop hemorrhoids or swollen, bulging veins (varicose veins).  Your breasts may begin to grow larger and become tender. Your nipples may stick out more, and the tissue that surrounds them (areola) may become darker.  Your gums may bleed and may be sensitive to brushing and flossing.  Dark spots or blotches (chloasma,  mask of pregnancy) may develop on your face. This will likely fade after the baby is born.  Your menstrual periods will stop.  You may have a loss of appetite.  You may develop cravings for certain kinds of food.  You may have changes in your emotions from day to day, such as being excited to be pregnant or being concerned that something may go wrong with the pregnancy and baby.  You may have more vivid and strange dreams.  You may have changes in your hair. These can include thickening of your hair, rapid growth, and changes in texture. Some women also have hair loss during or after pregnancy, or hair that feels dry or thin. Your hair will most likely return to normal after your baby is born. WHAT TO EXPECT AT YOUR PRENATAL VISITS During a routine prenatal visit:  You will be weighed to make sure you and the baby are growing normally.  Your blood pressure will be taken.  Your abdomen will be measured to track your baby's growth.  The fetal heartbeat will be listened to starting around week 10 or 12 of your pregnancy.  Test results from any previous visits will be discussed. Your health care provider may ask you:  How you are feeling.  If you are feeling the baby move.  If you have had any abnormal symptoms, such as leaking fluid, bleeding, severe headaches, or abdominal cramping.  If you are using any tobacco products,   including cigarettes, chewing tobacco, and electronic cigarettes.  If you have any questions. Other tests that may be performed during your first trimester include:  Blood tests to find your blood type and to check for the presence of any previous infections. They will also be used to check for low iron levels (anemia) and Rh antibodies. Later in the pregnancy, blood tests for diabetes will be done along with other tests if problems develop.  Urine tests to check for infections, diabetes, or protein in the urine.  An ultrasound to confirm the proper growth  and development of the baby.  An amniocentesis to check for possible genetic problems.  Fetal screens for spina bifida and Down syndrome.  You may need other tests to make sure you and the baby are doing well.  HIV (human immunodeficiency virus) testing. Routine prenatal testing includes screening for HIV, unless you choose not to have this test. HOME CARE INSTRUCTIONS  Medicines  Follow your health care provider's instructions regarding medicine use. Specific medicines may be either safe or unsafe to take during pregnancy.  Take your prenatal vitamins as directed.  If you develop constipation, try taking a stool softener if your health care provider approves. Diet  Eat regular, well-balanced meals. Choose a variety of foods, such as meat or vegetable-based protein, fish, milk and low-fat dairy products, vegetables, fruits, and whole grain breads and cereals. Your health care provider will help you determine the amount of weight gain that is right for you.  Avoid raw meat and uncooked cheese. These carry germs that can cause birth defects in the baby.  Eating four or five small meals rather than three large meals a day may help relieve nausea and vomiting. If you start to feel nauseous, eating a few soda crackers can be helpful. Drinking liquids between meals instead of during meals also seems to help nausea and vomiting.  If you develop constipation, eat more high-fiber foods, such as fresh vegetables or fruit and whole grains. Drink enough fluids to keep your urine clear or pale yellow. Activity and Exercise  Exercise only as directed by your health care provider. Exercising will help you:  Control your weight.  Stay in shape.  Be prepared for labor and delivery.  Experiencing pain or cramping in the lower abdomen or low back is a good sign that you should stop exercising. Check with your health care provider before continuing normal exercises.  Try to avoid standing for long  periods of time. Move your legs often if you must stand in one place for a long time.  Avoid heavy lifting.  Wear low-heeled shoes, and practice good posture.  You may continue to have sex unless your health care provider directs you otherwise. Relief of Pain or Discomfort  Wear a good support bra for breast tenderness.   Take warm sitz baths to soothe any pain or discomfort caused by hemorrhoids. Use hemorrhoid cream if your health care provider approves.   Rest with your legs elevated if you have leg cramps or low back pain.  If you develop varicose veins in your legs, wear support hose. Elevate your feet for 15 minutes, 3-4 times a day. Limit salt in your diet. Prenatal Care  Schedule your prenatal visits by the twelfth week of pregnancy. They are usually scheduled monthly at first, then more often in the last 2 months before delivery.  Write down your questions. Take them to your prenatal visits.  Keep all your prenatal visits as directed by your   health care provider. Safety  Wear your seat belt at all times when driving.  Make a list of emergency phone numbers, including numbers for family, friends, the hospital, and police and fire departments. General Tips  Ask your health care provider for a referral to a local prenatal education class. Begin classes no later than at the beginning of month 6 of your pregnancy.  Ask for help if you have counseling or nutritional needs during pregnancy. Your health care provider can offer advice or refer you to specialists for help with various needs.  Do not use hot tubs, steam rooms, or saunas.  Do not douche or use tampons or scented sanitary pads.  Do not cross your legs for long periods of time.  Avoid cat litter boxes and soil used by cats. These carry germs that can cause birth defects in the baby and possibly loss of the fetus by miscarriage or stillbirth.  Avoid all smoking, herbs, alcohol, and medicines not prescribed by  your health care provider. Chemicals in these affect the formation and growth of the baby.  Do not use any tobacco products, including cigarettes, chewing tobacco, and electronic cigarettes. If you need help quitting, ask your health care provider. You may receive counseling support and other resources to help you quit.  Schedule a dentist appointment. At home, brush your teeth with a soft toothbrush and be gentle when you floss. SEEK MEDICAL CARE IF:   You have dizziness.  You have mild pelvic cramps, pelvic pressure, or nagging pain in the abdominal area.  You have persistent nausea, vomiting, or diarrhea.  You have a bad smelling vaginal discharge.  You have pain with urination.  You notice increased swelling in your face, hands, legs, or ankles. SEEK IMMEDIATE MEDICAL CARE IF:   You have a fever.  You are leaking fluid from your vagina.  You have spotting or bleeding from your vagina.  You have severe abdominal cramping or pain.  You have rapid weight gain or loss.  You vomit blood or material that looks like coffee grounds.  You are exposed to German measles and have never had them.  You are exposed to fifth disease or chickenpox.  You develop a severe headache.  You have shortness of breath.  You have any kind of trauma, such as from a fall or a car accident.   This information is not intended to replace advice given to you by your health care provider. Make sure you discuss any questions you have with your health care provider.   Document Released: 09/28/2001 Document Revised: 10/25/2014 Document Reviewed: 08/14/2013 Elsevier Interactive Patient Education 2016 [employer]vier Inc[employer]  

## 2015-10-08 LAB — CYTOLOGY - PAP

## 2015-10-13 ENCOUNTER — Encounter: Payer: Self-pay | Admitting: Certified Nurse Midwife

## 2015-10-14 ENCOUNTER — Other Ambulatory Visit: Payer: Self-pay | Admitting: Certified Nurse Midwife

## 2015-10-14 DIAGNOSIS — R51 Headache: Principal | ICD-10-CM

## 2015-10-14 DIAGNOSIS — R519 Headache, unspecified: Secondary | ICD-10-CM | POA: Insufficient documentation

## 2015-10-14 MED ORDER — BUTALBITAL-ACETAMINOPHEN 50-300 MG PO TABS: ORAL_TABLET | ORAL | Status: DC

## 2015-11-04 ENCOUNTER — Encounter: Payer: Medicaid Other | Admitting: Certified Nurse Midwife

## 2015-11-05 ENCOUNTER — Ambulatory Visit (INDEPENDENT_AMBULATORY_CARE_PROVIDER_SITE_OTHER): Payer: Medicaid Other | Admitting: Obstetrics and Gynecology

## 2015-11-05 ENCOUNTER — Encounter: Payer: Self-pay | Admitting: Obstetrics and Gynecology

## 2015-11-05 VITALS — BP 120/75 | HR 101 | Wt 127.5 lb

## 2015-11-05 DIAGNOSIS — Z331 Pregnant state, incidental: Secondary | ICD-10-CM

## 2015-11-05 LAB — POCT URINALYSIS DIPSTICK
Bilirubin, UA: NEGATIVE
Blood, UA: NEGATIVE
GLUCOSE UA: NEGATIVE
Ketones, UA: NEGATIVE
Leukocytes, UA: NEGATIVE
NITRITE UA: NEGATIVE
Spec Grav, UA: 1.01
Urobilinogen, UA: 0.2
pH, UA: 7

## 2015-11-05 NOTE — Progress Notes (Signed)
ROB- pt denies any new complaints 

## 2015-11-05 NOTE — Progress Notes (Signed)
ROB- doing well, anatomy scan next visit 

## 2015-12-03 ENCOUNTER — Encounter: Payer: Self-pay | Admitting: Obstetrics and Gynecology

## 2015-12-03 ENCOUNTER — Ambulatory Visit (INDEPENDENT_AMBULATORY_CARE_PROVIDER_SITE_OTHER): Payer: Medicaid Other

## 2015-12-03 ENCOUNTER — Ambulatory Visit (INDEPENDENT_AMBULATORY_CARE_PROVIDER_SITE_OTHER): Payer: Medicaid Other | Admitting: Obstetrics and Gynecology

## 2015-12-03 VITALS — BP 97/61 | HR 86 | Wt 130.0 lb

## 2015-12-03 DIAGNOSIS — Z331 Pregnant state, incidental: Secondary | ICD-10-CM | POA: Diagnosis not present

## 2015-12-03 LAB — POCT URINALYSIS DIPSTICK
Bilirubin, UA: NEGATIVE
Blood, UA: NEGATIVE
GLUCOSE UA: NEGATIVE
Ketones, UA: NEGATIVE
LEUKOCYTES UA: NEGATIVE
Nitrite, UA: NEGATIVE
Protein, UA: NEGATIVE
SPEC GRAV UA: 1.01
UROBILINOGEN UA: 0.2
pH, UA: 7

## 2015-12-03 NOTE — Progress Notes (Signed)
ROB-pt denies any complaints 

## 2015-12-03 NOTE — Progress Notes (Signed)
Findings:  Singleton intrauterine pregnancy is visualized with FHR at 140 BPM. Biometrics give an (U/S) Gestational age of [redacted] weeks and an (U/S) EDD of 04/21/16; this correlates with the clinically established EDD of 04/21/16.  Fetal presentation is vertex, spine posterior.  EFW: 325 grams ( 0 lbs. 11 oz. ). Placenta: Posterior, grade 0 and remote to cervix by 4.9 cm. AFI: Adequate with a MVP of 6.2 cm.   Anatomic survey is complete and appears normal. Gender - female.   Right Ovary measures 4.0 x 1.6 x 2.5 cm. It appers normal in appearance. Left Ovary measures 3.6 x 1.7 x 1.9 cm. It appears normal in appearance. There is no evidence of a corpus luteal cyst. Survey of the adnexa demonstrates no adnexal masses. There is no free peritoneal fluid in the cul de sac.  Impression: 1. 20 week Viable Singleton Intrauterine pregnancy by U/S. 2. (U/S) EDD is consistent with Clinically established (LMP) EDD of 04/21/16. 3. Normal appearing anatomy scan.

## 2015-12-31 ENCOUNTER — Encounter: Payer: Self-pay | Admitting: Obstetrics and Gynecology

## 2015-12-31 ENCOUNTER — Ambulatory Visit (INDEPENDENT_AMBULATORY_CARE_PROVIDER_SITE_OTHER): Payer: Medicaid Other | Admitting: Obstetrics and Gynecology

## 2015-12-31 ENCOUNTER — Encounter: Payer: Medicaid Other | Admitting: Obstetrics and Gynecology

## 2015-12-31 VITALS — BP 117/68 | HR 97 | Wt 132.5 lb

## 2015-12-31 DIAGNOSIS — Z331 Pregnant state, incidental: Secondary | ICD-10-CM

## 2015-12-31 LAB — POCT URINALYSIS DIPSTICK
Bilirubin, UA: NEGATIVE
Glucose, UA: NEGATIVE
KETONES UA: NEGATIVE
Leukocytes, UA: NEGATIVE
Nitrite, UA: NEGATIVE
PH UA: 6
PROTEIN UA: NEGATIVE
RBC UA: NEGATIVE
SPEC GRAV UA: 1.01
Urobilinogen, UA: 0.2

## 2015-12-31 NOTE — Progress Notes (Signed)
ROB-pt denies any complaints 

## 2015-12-31 NOTE — Progress Notes (Signed)
ROB- doing well, recommend enrolling in CBC, BFC, ICC-schedule given.Glucola next visit.

## 2016-01-26 ENCOUNTER — Other Ambulatory Visit: Payer: Self-pay | Admitting: *Deleted

## 2016-01-26 DIAGNOSIS — Z331 Pregnant state, incidental: Secondary | ICD-10-CM

## 2016-01-26 DIAGNOSIS — Z131 Encounter for screening for diabetes mellitus: Secondary | ICD-10-CM

## 2016-01-29 ENCOUNTER — Other Ambulatory Visit: Payer: Self-pay | Admitting: Obstetrics and Gynecology

## 2016-01-29 ENCOUNTER — Encounter: Payer: Self-pay | Admitting: Obstetrics and Gynecology

## 2016-01-29 ENCOUNTER — Ambulatory Visit (INDEPENDENT_AMBULATORY_CARE_PROVIDER_SITE_OTHER): Payer: Medicaid Other | Admitting: Obstetrics and Gynecology

## 2016-01-29 ENCOUNTER — Other Ambulatory Visit: Payer: Self-pay

## 2016-01-29 VITALS — BP 108/78 | HR 102 | Wt 142.3 lb

## 2016-01-29 DIAGNOSIS — Z331 Pregnant state, incidental: Secondary | ICD-10-CM | POA: Diagnosis not present

## 2016-01-29 DIAGNOSIS — Z23 Encounter for immunization: Secondary | ICD-10-CM | POA: Diagnosis not present

## 2016-01-29 DIAGNOSIS — Z131 Encounter for screening for diabetes mellitus: Secondary | ICD-10-CM

## 2016-01-29 MED ORDER — TETANUS-DIPHTH-ACELL PERTUSSIS 5-2.5-18.5 LF-MCG/0.5 IM SUSP
0.5000 mL | Freq: Once | INTRAMUSCULAR | Status: AC
  Administered 2016-01-29: 0.5 mL via INTRAMUSCULAR

## 2016-01-29 NOTE — Progress Notes (Signed)
ROB-glucola done, feeling good, dicussed cord blood donation

## 2016-01-29 NOTE — Patient Instructions (Signed)

## 2016-01-29 NOTE — Progress Notes (Signed)
ROB- glucola done,blood consent signed,tdap given Pt is feeling great

## 2016-01-30 LAB — HEMOGLOBIN AND HEMATOCRIT, BLOOD
HEMATOCRIT: 32.9 % — AB (ref 34.0–46.6)
HEMOGLOBIN: 11.1 g/dL (ref 11.1–15.9)

## 2016-01-30 LAB — GLUCOSE, 1 HOUR GESTATIONAL: Gestational Diabetes Screen: 136 mg/dL (ref 65–139)

## 2016-02-03 ENCOUNTER — Encounter: Payer: Self-pay | Admitting: Obstetrics and Gynecology

## 2016-02-03 NOTE — Telephone Encounter (Signed)
-----   Message from Purcell NailsMelody N Shambley, [place] sent at 02/02/2016 10:07 AM EDT ----- Please call her to schedule the three hour glucola

## 2016-02-10 ENCOUNTER — Encounter: Payer: Self-pay | Admitting: Obstetrics and Gynecology

## 2016-02-10 ENCOUNTER — Encounter: Payer: Medicaid Other | Admitting: Obstetrics and Gynecology

## 2016-02-12 ENCOUNTER — Encounter: Payer: Self-pay | Admitting: Obstetrics and Gynecology

## 2016-02-12 ENCOUNTER — Other Ambulatory Visit: Payer: Medicaid Other

## 2016-02-12 ENCOUNTER — Ambulatory Visit (INDEPENDENT_AMBULATORY_CARE_PROVIDER_SITE_OTHER): Payer: Medicaid Other | Admitting: Obstetrics and Gynecology

## 2016-02-12 ENCOUNTER — Ambulatory Visit: Payer: Medicaid Other | Admitting: Obstetrics and Gynecology

## 2016-02-12 VITALS — BP 98/63 | HR 79 | Wt 142.5 lb

## 2016-02-12 DIAGNOSIS — Z331 Pregnant state, incidental: Secondary | ICD-10-CM

## 2016-02-12 DIAGNOSIS — R7309 Other abnormal glucose: Secondary | ICD-10-CM

## 2016-02-12 DIAGNOSIS — R7302 Impaired glucose tolerance (oral): Secondary | ICD-10-CM

## 2016-02-12 LAB — POCT URINALYSIS DIPSTICK
Bilirubin, UA: NEGATIVE
Blood, UA: NEGATIVE
GLUCOSE UA: NEGATIVE
Ketones, UA: NEGATIVE
LEUKOCYTES UA: NEGATIVE
NITRITE UA: NEGATIVE
Protein, UA: NEGATIVE
Spec Grav, UA: 1.01
UROBILINOGEN UA: 0.2
pH, UA: 6.5

## 2016-02-12 NOTE — Progress Notes (Signed)
ROB- doing well, 3hGTT

## 2016-02-12 NOTE — Progress Notes (Signed)
ROB- pt is doing well 

## 2016-02-13 ENCOUNTER — Ambulatory Visit: Payer: Medicaid Other | Admitting: Obstetrics and Gynecology

## 2016-02-13 LAB — GESTATIONAL GLUCOSE TOLERANCE
GLUCOSE 2 HOUR GTT: 120 mg/dL (ref 65–154)
Glucose, Fasting: 83 mg/dL (ref 65–94)
Glucose, GTT - 1 Hour: 138 mg/dL (ref 65–179)
Glucose, GTT - 3 Hour: 67 mg/dL (ref 65–139)

## 2016-02-26 ENCOUNTER — Encounter: Payer: Self-pay | Admitting: Obstetrics and Gynecology

## 2016-02-26 ENCOUNTER — Ambulatory Visit (INDEPENDENT_AMBULATORY_CARE_PROVIDER_SITE_OTHER): Payer: Medicaid Other | Admitting: Obstetrics and Gynecology

## 2016-02-26 VITALS — BP 104/63 | HR 93 | Wt 145.8 lb

## 2016-02-26 DIAGNOSIS — Z331 Pregnant state, incidental: Secondary | ICD-10-CM

## 2016-02-26 LAB — POCT URINALYSIS DIPSTICK
Bilirubin, UA: NEGATIVE
Blood, UA: NEGATIVE
GLUCOSE UA: NEGATIVE
KETONES UA: NEGATIVE
Nitrite, UA: NEGATIVE
PROTEIN UA: NEGATIVE
Spec Grav, UA: 1.01
Urobilinogen, UA: 0.2
pH, UA: 6.5

## 2016-02-26 NOTE — Progress Notes (Signed)
ROB- doing well, considering Depo PP (used it in the past), and plans to breastfeed

## 2016-02-26 NOTE — Progress Notes (Signed)
ROB-pt denies any complaints 

## 2016-03-11 ENCOUNTER — Ambulatory Visit (INDEPENDENT_AMBULATORY_CARE_PROVIDER_SITE_OTHER): Payer: Medicaid Other | Admitting: Obstetrics and Gynecology

## 2016-03-11 ENCOUNTER — Encounter: Payer: Self-pay | Admitting: Obstetrics and Gynecology

## 2016-03-11 VITALS — BP 107/67 | HR 99 | Wt 147.3 lb

## 2016-03-11 DIAGNOSIS — Z3493 Encounter for supervision of normal pregnancy, unspecified, third trimester: Secondary | ICD-10-CM

## 2016-03-11 LAB — POCT URINALYSIS DIPSTICK
Bilirubin, UA: NEGATIVE
Blood, UA: NEGATIVE
Glucose, UA: NEGATIVE
Ketones, UA: NEGATIVE
NITRITE UA: NEGATIVE
PH UA: 6
SPEC GRAV UA: 1.02
Urobilinogen, UA: 0.2

## 2016-03-11 NOTE — Progress Notes (Signed)
Good fetal movement. No contractions. Return in 2 weeks

## 2016-03-11 NOTE — Progress Notes (Signed)
ROB- no complaints.  

## 2016-03-25 ENCOUNTER — Encounter: Payer: Medicaid Other | Admitting: Obstetrics and Gynecology

## 2016-04-01 ENCOUNTER — Encounter: Payer: Medicaid Other | Admitting: Obstetrics and Gynecology

## 2016-04-08 ENCOUNTER — Encounter: Payer: Medicaid Other | Admitting: Obstetrics and Gynecology

## 2016-04-09 ENCOUNTER — Telehealth: Payer: Self-pay | Admitting: Obstetrics and Gynecology

## 2016-04-09 NOTE — Telephone Encounter (Signed)
SAVANNAH'S INS IS NO LONGER IN AFFECT. i GOT A REQUEST FROM HEALTH DEPT FOR HER RECORDS, SO I CALLED. THE LADY I SPOKE TO TOLD ME WHY SHE WAS GOING BACK TO [facility], BECAUSE OF HER INS.  JUST WANTED YOU TO KNOW. SHE IS 38 WKS AND THE LADY SAID WE WOULD STILL DELIVER THE BABY.

## 2016-04-09 NOTE — Telephone Encounter (Signed)
fyi

## 2016-04-15 ENCOUNTER — Encounter: Payer: Medicaid Other | Admitting: Obstetrics and Gynecology

## 2016-04-22 ENCOUNTER — Encounter: Payer: Medicaid Other | Admitting: Obstetrics and Gynecology

## 2016-07-07 ENCOUNTER — Encounter (HOSPITAL_COMMUNITY): Payer: Self-pay

## 2016-10-18 NOTE — L&D Delivery Note (Signed)
Delivery Note At 1956 a viable and healthy female "Richardson DoppCole"  was delivered via  (Presentation:OA ;  ).  APGAR:8 ,9 ; weight 8#7oz  .   Placenta status: delivered intact with 3 vessel  Cord:  with the following complications: none  Anesthesia:  epidural Episiotomy:  none Lacerations:  none Suture Repair: NA Est. Blood Loss (mL):  100  Mom to postpartum.  Baby to Couplet care / Skin to Skin.  Melody N Shambley 06/22/2017, 8:12 PM

## 2016-11-23 ENCOUNTER — Ambulatory Visit: Payer: Medicaid Other

## 2016-11-23 VITALS — BP 112/76 | HR 117 | Ht 65.0 in | Wt 132.2 lb

## 2016-11-23 DIAGNOSIS — N926 Irregular menstruation, unspecified: Secondary | ICD-10-CM

## 2016-11-23 NOTE — Progress Notes (Unsigned)
[name] presents for NOB nurse interview visit. Pregnancy confirmation done at [facility]..  G- 3.  P- 1. Pregnancy education material explained and given.No_ cats in the home. NOB labs ordered.  HIV labs and Drug screen were explained optional and she did not decline. Drug screen ordered. PNV encouraged. Genetic screening to discuss with provider. Pt. To follow up with provider in 1_ weeks for NOB physical.  All questions answered.

## 2016-11-24 ENCOUNTER — Other Ambulatory Visit: Payer: Self-pay | Admitting: Obstetrics and Gynecology

## 2016-11-24 LAB — ANTIBODY SCREEN: ANTIBODY SCREEN: NEGATIVE

## 2016-11-24 LAB — URINALYSIS, ROUTINE W REFLEX MICROSCOPIC
BILIRUBIN UA: NEGATIVE
Glucose, UA: NEGATIVE
KETONES UA: NEGATIVE
Nitrite, UA: NEGATIVE
PH UA: 6 (ref 5.0–7.5)
RBC UA: NEGATIVE
Specific Gravity, UA: 1.027 (ref 1.005–1.030)
UUROB: 0.2 mg/dL (ref 0.2–1.0)

## 2016-11-24 LAB — HEP, RPR, HIV PANEL
HIV SCREEN 4TH GENERATION: NONREACTIVE
Hepatitis B Surface Ag: NEGATIVE
RPR: REACTIVE — AB

## 2016-11-24 LAB — MONITOR DRUG PROFILE 14(MW)
Amphetamine Scrn, Ur: NEGATIVE ng/mL
BARBITURATE SCREEN URINE: NEGATIVE ng/mL
BENZODIAZEPINE SCREEN, URINE: NEGATIVE ng/mL
BUPRENORPHINE, URINE: NEGATIVE ng/mL
CANNABINOIDS UR QL SCN: NEGATIVE ng/mL
COCAINE(METAB.)SCREEN, URINE: NEGATIVE ng/mL
Creatinine(Crt), U: 182.8 mg/dL (ref 20.0–300.0)
Fentanyl, Urine: NEGATIVE pg/mL
MEPERIDINE SCREEN, URINE: NEGATIVE ng/mL
METHADONE SCREEN, URINE: NEGATIVE ng/mL
OXYCODONE+OXYMORPHONE UR QL SCN: NEGATIVE ng/mL
Opiate Scrn, Ur: NEGATIVE ng/mL
PHENCYCLIDINE QUANTITATIVE URINE: NEGATIVE ng/mL
PROPOXYPHENE SCREEN URINE: NEGATIVE ng/mL
Ph of Urine: 5.9 (ref 4.5–8.9)
SPECIFIC GRAVITY: 1.029
Tramadol Screen, Urine: NEGATIVE ng/mL

## 2016-11-24 LAB — RUBELLA SCREEN: Rubella Antibodies, IGG: 2.66 index (ref >0.99)

## 2016-11-24 LAB — CBC WITH DIFFERENTIAL/PLATELET
BASOS: 0 %
Basophils Absolute: 0 10*3/uL (ref 0.0–0.2)
EOS (ABSOLUTE): 0.1 10*3/uL (ref 0.0–0.4)
EOS: 1 %
HEMATOCRIT: 39.5 % (ref 34.0–46.6)
HEMOGLOBIN: 12.5 g/dL (ref 11.1–15.9)
IMMATURE GRANS (ABS): 0 10*3/uL (ref 0.0–0.1)
Immature Granulocytes: 0 %
LYMPHS: 23 %
Lymphocytes Absolute: 2.3 10*3/uL (ref 0.7–3.1)
MCH: 27.4 pg (ref 26.6–33.0)
MCHC: 31.6 g/dL (ref 31.5–35.7)
MCV: 87 fL (ref 79–97)
MONOCYTES: 5 %
Monocytes Absolute: 0.5 10*3/uL (ref 0.1–0.9)
NEUTROS ABS: 7 10*3/uL (ref 1.4–7.0)
NEUTROS PCT: 71 %
PLATELETS: 226 10*3/uL (ref 150–379)
RBC: 4.56 x10E6/uL (ref 3.77–5.28)
RDW: 14.4 % (ref 12.3–15.4)
WBC: 9.9 10*3/uL (ref 3.4–10.8)

## 2016-11-24 LAB — MICROSCOPIC EXAMINATION: CASTS: NONE SEEN /LPF

## 2016-11-24 LAB — GC/CHLAMYDIA PROBE AMP
Chlamydia trachomatis, NAA: NEGATIVE
Neisseria gonorrhoeae by PCR: NEGATIVE

## 2016-11-24 LAB — RPR, QUANT. (REFLEX): Rapid Plasma Reagin, Quant: 1:1 {titer} — ABNORMAL HIGH

## 2016-11-24 LAB — VARICELLA ZOSTER ANTIBODY, IGG: Varicella zoster IgG: <135 index — ABNORMAL LOW (ref >165)

## 2016-11-24 LAB — ABO AND RH: Rh Factor: POSITIVE

## 2016-11-25 LAB — URINE CULTURE

## 2016-11-26 ENCOUNTER — Encounter: Payer: Medicaid Other | Admitting: Certified Nurse Midwife

## 2016-11-30 LAB — RPR: RPR Ser Ql: REACTIVE — AB

## 2016-11-30 LAB — RPR, QUANT+TP ABS (REFLEX)
Rapid Plasma Reagin, Quant: 1:1 {titer} — ABNORMAL HIGH
T Pallidum Abs: NEGATIVE

## 2016-11-30 LAB — SPECIMEN STATUS REPORT

## 2016-12-13 ENCOUNTER — Encounter: Payer: Self-pay | Admitting: Certified Nurse Midwife

## 2016-12-13 ENCOUNTER — Ambulatory Visit (INDEPENDENT_AMBULATORY_CARE_PROVIDER_SITE_OTHER): Payer: Medicaid Other | Admitting: Certified Nurse Midwife

## 2016-12-13 VITALS — BP 92/64 | HR 105 | Wt 134.0 lb

## 2016-12-13 DIAGNOSIS — Z3480 Encounter for supervision of other normal pregnancy, unspecified trimester: Secondary | ICD-10-CM

## 2016-12-13 DIAGNOSIS — O09892 Supervision of other high risk pregnancies, second trimester: Secondary | ICD-10-CM | POA: Diagnosis not present

## 2016-12-13 LAB — POCT URINALYSIS DIPSTICK
Bilirubin, UA: NEGATIVE
Glucose, UA: NEGATIVE
KETONES UA: NEGATIVE
Leukocytes, UA: NEGATIVE
Nitrite, UA: NEGATIVE
PH UA: 6
PROTEIN UA: NEGATIVE
RBC UA: NEGATIVE
UROBILINOGEN UA: NEGATIVE

## 2016-12-13 NOTE — Progress Notes (Signed)
NEW OB HISTORY AND PHYSICAL  SUBJECTIVE:       Joan Moreno is a 26 y.o. G82P1011 female, Patient's last menstrual period was 08/27/2016., Estimated Date of Delivery: 06/03/17, [redacted]w[redacted]d, presents today for establishment of Prenatal Care.  Denies difficulty breathing or respiratory distress, chest pain, nausea, vomiting, abdominal cramping or pain, dysuria, vaginal bleeding or discharge, and leg pain or swelling.   She is taking a prenatal vitamin with folic acid and DHA. This pregnancy was unplanned, but not prevented. Pt is "fine with it".  She has been with FOB for the last five years, but they did break up and get back together. He is also the father of her 16 (45) month old.   Declines genetic screening.    Gynecologic History  Patient's last menstrual period was 08/27/2016. History of irregular periods.   Last Pap: 10/07/2015. Results were: normal  Obstetric History OB History  Gravida Para Term Preterm AB Living  3 1 1   1 1   SAB TAB Ectopic Multiple Live Births  1       1    # Outcome Date GA Lbr Len/2nd Weight Sex Delivery Anes PTL Lv  3 Current           2 Term 04/15/16 [redacted]w[redacted]d  7 lb 5.5 oz (3.331 kg) F Vag-Spont EPI  LIV  1 SAB 04/2015        FD      Past Medical History:  Diagnosis Date  . Personal history of asthma    as a child    Past Surgical History:  Procedure Laterality Date  . broke leg     put to sleep to put back in place  . TONSILLECTOMY      Current Outpatient Prescriptions on File Prior to Visit  Medication Sig Dispense Refill  . Prenatal Vit-Fe Fumarate-FA (MULTIVITAMIN-PRENATAL) 27-0.8 MG TABS tablet Take 1 tablet by mouth daily at 12 noon.     No current facility-administered medications on file prior to visit.     Allergies  Allergen Reactions  . Sulfa Antibiotics Hives and Rash    Social History   Social History  . Marital status: Single    Spouse name: N/A  . Number of children: N/A  . Years of education: N/A   Occupational  History  . unemployed    Social History Main Topics  . Smoking status: Former Smoker    Packs/day: 0.50    Years: 0.50    Types: Cigarettes    Quit date: 10/06/2014  . Smokeless tobacco: Never Used  . Alcohol use No  . Drug use: No  . Sexual activity: Yes    Partners: Male    Birth control/ protection: None   Other Topics Concern  . Not on file   Social History Narrative  . No narrative on file    Family History  Problem Relation Age of Onset  . Diabetes Mother     borderline  . Diabetes Maternal Grandfather     aunt, uncle    The following portions of the patient's history were reviewed and updated as appropriate: allergies, current medications, past OB history, past medical history, past surgical history, past family history, past social history, and problem list.    OBJECTIVE: Initial Physical Exam (New OB)  GENERAL APPEARANCE: alert, well appearing, in no apparent distress  HEAD: normocephalic, atraumatic  MOUTH: mucous membranes moist, pharynx normal without lesions and dental hygiene good  THYROID: no thyromegaly or masses present  BREASTS: no masses noted, no significant tenderness, no palpable axillary nodes, no skin changes  LUNGS: clear to auscultation, no wheezes, rales or rhonchi, symmetric air entry  HEART: regular rate and rhythm, no murmurs  ABDOMEN: soft, nontender, nondistended, no abnormal masses, no epigastric pain, fundus not palpable and FHT present  EXTREMITIES: no redness or tenderness in the calves or thighs, no edema  SKIN: normal coloration and turgor, no rashes, four (4) professional tattoos present  LYMPH NODES: no adenopathy palpable  NEUROLOGIC: alert, oriented, normal speech, no focal findings or movement disorder noted  PELVIC EXAM EXTERNAL GENITALIA: normal appearing vulva with no masses, tenderness or lesions VAGINA: no abnormal discharge or lesions CERVIX: no lesions or cervical motion tenderness UTERUS: gravid and  S<D ADNEXA: no masses palpable and nontender  ASSESSMENT: Normal pregnancy Short interval between pregnancies S<D  PLAN: Prenatal care Discussed New OB lab results including positive RPR Dating US scheduled 12/14/2016 Reviewed red flag symptoms and when to call See orders   Gunnar BullaJenkins Michelle Candee Hoon, CNM

## 2016-12-13 NOTE — Patient Instructions (Signed)
Second Trimester of Pregnancy The second trimester is from week 13 through week 28, month 4 through 6. This is often the time in pregnancy that you feel your best. Often times, morning sickness has lessened or quit. You may have more energy, and you may get hungry more often. Your unborn baby (fetus) is growing rapidly. At the end of the sixth month, he or she is about 9 inches long and weighs about 1 pounds. You will likely feel the baby move (quickening) between 18 and 20 weeks of pregnancy. Follow these instructions at home:  Avoid all smoking, herbs, and alcohol. Avoid drugs not approved by your doctor.  Do not use any tobacco products, including cigarettes, chewing tobacco, and electronic cigarettes. If you need help quitting, ask your doctor. You may get counseling or other support to help you quit.  Only take medicine as told by your doctor. Some medicines are safe and some are not during pregnancy.  Exercise only as told by your doctor. Stop exercising if you start having cramps.  Eat regular, healthy meals.  Wear a good support bra if your breasts are tender.  Do not use hot tubs, steam rooms, or saunas.  Wear your seat belt when driving.  Avoid raw meat, uncooked cheese, and liter boxes and soil used by cats.  Take your prenatal vitamins.  Take 1500-2000 milligrams of calcium daily starting at the 20th week of pregnancy until you deliver your baby.  Try taking medicine that helps you poop (stool softener) as needed, and if your doctor approves. Eat more fiber by eating fresh fruit, vegetables, and whole grains. Drink enough fluids to keep your pee (urine) clear or pale yellow.  Take warm water baths (sitz baths) to soothe pain or discomfort caused by hemorrhoids. Use hemorrhoid cream if your doctor approves.  If you have puffy, bulging veins (varicose veins), wear support hose. Raise (elevate) your feet for 15 minutes, 3-4 times a day. Limit salt in your diet.  Avoid heavy  lifting, wear low heals, and sit up straight.  Rest with your legs raised if you have leg cramps or low back pain.  Visit your dentist if you have not gone during your pregnancy. Use a soft toothbrush to brush your teeth. Be gentle when you floss.  You can have sex (intercourse) unless your doctor tells you not to.  Go to your doctor visits. Get help if:  You feel dizzy.  You have mild cramps or pressure in your lower belly (abdomen).  You have a nagging pain in your belly area.  You continue to feel sick to your stomach (nauseous), throw up (vomit), or have watery poop (diarrhea).  You have bad smelling fluid coming from your vagina.  You have pain with peeing (urination). Get help right away if:  You have a fever.  You are leaking fluid from your vagina.  You have spotting or bleeding from your vagina.  You have severe belly cramping or pain.  You lose or gain weight rapidly.  You have trouble catching your breath and have chest pain.  You notice sudden or extreme puffiness (swelling) of your face, hands, ankles, feet, or legs.  You have not felt the baby move in over an hour.  You have severe headaches that do not go away with medicine.  You have vision changes. This information is not intended to replace advice given to you by your health care provider. Make sure you discuss any questions you have with your health care   provider. Document Released: 12/29/2009 Document Revised: 03/11/2016 Document Reviewed: 12/05/2012 Elsevier Interactive Patient Education  2017 [employer]  

## 2016-12-14 ENCOUNTER — Ambulatory Visit (INDEPENDENT_AMBULATORY_CARE_PROVIDER_SITE_OTHER): Payer: Medicaid Other

## 2016-12-14 DIAGNOSIS — O09892 Supervision of other high risk pregnancies, second trimester: Secondary | ICD-10-CM

## 2016-12-16 ENCOUNTER — Encounter: Payer: Medicaid Other | Admitting: Certified Nurse Midwife

## 2017-01-10 ENCOUNTER — Ambulatory Visit (INDEPENDENT_AMBULATORY_CARE_PROVIDER_SITE_OTHER): Payer: Medicaid Other | Admitting: Certified Nurse Midwife

## 2017-01-10 VITALS — BP 104/59 | HR 87 | Wt 135.6 lb

## 2017-01-10 DIAGNOSIS — Z3482 Encounter for supervision of other normal pregnancy, second trimester: Secondary | ICD-10-CM

## 2017-01-10 LAB — POCT URINALYSIS DIPSTICK
Bilirubin, UA: NEGATIVE
Blood, UA: NEGATIVE
Glucose, UA: NEGATIVE
Ketones, UA: NEGATIVE
NITRITE UA: NEGATIVE
PH UA: 8 (ref 5.0–8.0)
PROTEIN UA: NEGATIVE
Spec Grav, UA: 1.01 (ref 1.030–1.035)
UROBILINOGEN UA: NEGATIVE (ref ?–2.0)

## 2017-01-10 NOTE — Progress Notes (Signed)
ROB-Pt is doing well. Discussed round ligament pain and home treatment measures. Reviewed red flag symptoms and when to call. RTC x 4 weeks for anatomy scan and ROB or sooner if needed.

## 2017-01-10 NOTE — Patient Instructions (Signed)
Round Ligament Pain The round ligament is a cord of muscle and tissue that helps to support the uterus. It can become a source of pain during pregnancy if it becomes stretched or twisted as the baby grows. The pain usually begins in the second trimester of pregnancy, and it can come and go until the baby is delivered. It is not a serious problem, and it does not cause harm to the baby. Round ligament pain is usually a short, sharp, and pinching pain, but it can also be a dull, lingering, and aching pain. The pain is felt in the lower side of the abdomen or in the groin. It usually starts deep in the groin and moves up to the outside of the hip area. Pain can occur with:  A sudden change in position.  Rolling over in bed.  Coughing or sneezing.  Physical activity. Follow these instructions at home: Watch your condition for any changes. Take these steps to help with your pain:  When the pain starts, relax. Then try:  Sitting down.  Flexing your knees up to your abdomen.  Lying on your side with one pillow under your abdomen and another pillow between your legs.  Sitting in a warm bath for 15-20 minutes or until the pain goes away.  Take over-the-counter and prescription medicines only as told by your health care provider.  Move slowly when you sit and stand.  Avoid long walks if they cause pain.  Stop or lessen your physical activities if they cause pain. Contact a health care provider if:  Your pain does not go away with treatment.  You feel pain in your back that you did not have before.  Your medicine is not helping. Get help right away if:  You develop a fever or chills.  You develop uterine contractions.  You develop vaginal bleeding.  You develop nausea or vomiting.  You develop diarrhea.  You have pain when you urinate. This information is not intended to replace advice given to you by your health care provider. Make sure you discuss any questions you have with  your health care provider. Document Released: 07/13/2008 Document Revised: 03/11/2016 Document Reviewed: 12/11/2014 Elsevier Interactive Patient Education  2017 [employer] Inc.  

## 2017-02-07 ENCOUNTER — Encounter: Payer: Self-pay | Admitting: Certified Nurse Midwife

## 2017-02-07 ENCOUNTER — Ambulatory Visit (INDEPENDENT_AMBULATORY_CARE_PROVIDER_SITE_OTHER): Payer: Medicaid Other

## 2017-02-07 ENCOUNTER — Ambulatory Visit (INDEPENDENT_AMBULATORY_CARE_PROVIDER_SITE_OTHER): Payer: Medicaid Other | Admitting: Certified Nurse Midwife

## 2017-02-07 VITALS — BP 93/50 | HR 75 | Wt 142.6 lb

## 2017-02-07 DIAGNOSIS — Z3482 Encounter for supervision of other normal pregnancy, second trimester: Secondary | ICD-10-CM | POA: Diagnosis not present

## 2017-02-07 DIAGNOSIS — Z3492 Encounter for supervision of normal pregnancy, unspecified, second trimester: Secondary | ICD-10-CM

## 2017-02-07 LAB — POCT URINALYSIS DIPSTICK
BILIRUBIN UA: NEGATIVE
Blood, UA: NEGATIVE
Glucose, UA: NEGATIVE
KETONES UA: NEGATIVE
LEUKOCYTES UA: NEGATIVE
Nitrite, UA: NEGATIVE
PH UA: 5 (ref 5.0–8.0)
Protein, UA: NEGATIVE
SPEC GRAV UA: 1.01 (ref 1.010–1.025)
Urobilinogen, UA: NEGATIVE E.U./dL — AB

## 2017-02-07 NOTE — Progress Notes (Signed)
ROB-Pt doing well, no questions or concerns. Anatomy scan WNL, findings reviewed with pt. Discussed red flag symptoms and when to call. RTC x 4 weeks for ROB.   ULTRASOUND REPORT  Location: [facility] Date of Service: 02/07/17  Indications:Anatomy U/S Findings:  Joan Moreno intrauterine pregnancy is visualized with FHR at 160 BPM. Biometrics give an (U/S) Gestational age of 20 2/7 weeks and an (U/S) EDD of 06/18/17; this correlates with the clinically established EDD of 06/21/17.  Fetal presentation is Vertex.  EFW: 410g (14 oz). Placenta: Anterior, grade 0, 3.4 cm from internal os. AFI: Adequate with MVP of 5.4 cm.  Anatomic survey is complete and normal; Gender - female  .   Ovaries are not seen Survey of the adnexa demonstrates no adnexal masses. There is no free peritoneal fluid in the cul de sac.  Impression: 1. 21 2/7 week Viable Singleton Intrauterine pregnancy by U/S. 2. (U/S) EDD is consistent with Clinically established (LMP) EDD of 06/21/17. 3. Normal Anatomy Scan  Recommendations: 1.Clinical correlation with the patient's History and Physical Exam.

## 2017-02-07 NOTE — Patient Instructions (Signed)
Round Ligament Pain The round ligament is a cord of muscle and tissue that helps to support the uterus. It can become a source of pain during pregnancy if it becomes stretched or twisted as the baby grows. The pain usually begins in the second trimester of pregnancy, and it can come and go until the baby is delivered. It is not a serious problem, and it does not cause harm to the baby. Round ligament pain is usually a short, sharp, and pinching pain, but it can also be a dull, lingering, and aching pain. The pain is felt in the lower side of the abdomen or in the groin. It usually starts deep in the groin and moves up to the outside of the hip area. Pain can occur with:  A sudden change in position.  Rolling over in bed.  Coughing or sneezing.  Physical activity. Follow these instructions at home: Watch your condition for any changes. Take these steps to help with your pain:  When the pain starts, relax. Then try:  Sitting down.  Flexing your knees up to your abdomen.  Lying on your side with one pillow under your abdomen and another pillow between your legs.  Sitting in a warm bath for 15-20 minutes or until the pain goes away.  Take over-the-counter and prescription medicines only as told by your health care provider.  Move slowly when you sit and stand.  Avoid long walks if they cause pain.  Stop or lessen your physical activities if they cause pain. Contact a health care provider if:  Your pain does not go away with treatment.  You feel pain in your back that you did not have before.  Your medicine is not helping. Get help right away if:  You develop a fever or chills.  You develop uterine contractions.  You develop vaginal bleeding.  You develop nausea or vomiting.  You develop diarrhea.  You have pain when you urinate. This information is not intended to replace advice given to you by your health care provider. Make sure you discuss any questions you have with  your health care provider. Document Released: 07/13/2008 Document Revised: 03/11/2016 Document Reviewed: 12/11/2014 Elsevier Interactive Patient Education  2017 [place].  Common Medications Safe in Pregnancy  Acne:      Constipation:  Benzoyl Peroxide     Colace  Clindamycin      Dulcolax Suppository  Topica Erythromycin     Fibercon  Salicylic Acid      Metamucil         Miralax AVOID:        Senakot   Accutane    Cough:  Retin-A       Cough Drops  Tetracycline      Phenergan w/ Codeine if Rx  Minocycline      Robitussin (Plain & DM)  Antibiotics:     Crabs/Lice:  Ceclor       RID  Cephalosporins    AVOID:  E-Mycins      Kwell  Keflex  Macrobid/Macrodantin   Diarrhea:  Penicillin      Kao-Pectate  Zithromax      Imodium AD         PUSH FLUIDS AVOID:       Cipro     Fever:  Tetracycline      Tylenol (Regular or Extra  Minocycline       Strength)  Levaquin      Extra Strength-Do not  Exceed 8 tabs/24 hrs Caffeine:        '200mg'$ /day (equiv. To 1 cup of coffee or  approx. 3 12 oz sodas)         Gas: Cold/Hayfever:       Gas-X  Benadryl      Mylicon  Claritin       Phazyme  **Claritin-D        Chlor-Trimeton    Headaches:  Dimetapp      ASA-Free Excedrin  Drixoral-Non-Drowsy     Cold Compress  Mucinex (Guaifenasin)     Tylenol (Regular or Extra  Sudafed/Sudafed-12 Hour     Strength)  **Sudafed PE Pseudoephedrine   Tylenol Cold & Sinus     Vicks Vapor Rub  Zyrtec  **AVOID if Problems With Blood Pressure         Heartburn: Avoid lying down for at least 1 hour after meals  Aciphex      Maalox     Rash:  Milk of Magnesia     Benadryl    Mylanta       1% Hydrocortisone Cream  Pepcid  Pepcid Complete   Sleep Aids:  Prevacid      Ambien   Prilosec       Benadryl  Rolaids       Chamomile Tea  Tums (Limit 4/day)     Unisom  Zantac       Tylenol PM         Warm milk-add vanilla or  Hemorrhoids:       Sugar for taste  Anusol/Anusol H.C.  (RX:  Analapram 2.5%)  Sugar Substitutes:  Hydrocortisone OTC     Ok in moderation  Preparation H      Tucks        Vaseline lotion applied to tissue with wiping    Herpes:     Throat:  Acyclovir      Oragel  Famvir  Valtrex     Vaccines:         Flu Shot Leg Cramps:       *Gardasil  Benadryl      Hepatitis A         Hepatitis B Nasal Spray:       Pneumovax  Saline Nasal Spray     Polio Booster         Tetanus Nausea:       Tuberculosis test or PPD  Vitamin B6 25 mg TID   AVOID:    Dramamine      *Gardasil  Emetrol       Live Poliovirus  Ginger Root 250 mg QID    MMR (measles, mumps &  High Complex Carbs @ Bedtime    rebella)  Sea Bands-Accupressure    Varicella (Chickenpox)  Unisom 1/2 tab TID     *No known complications           If received before Pain:         Known pregnancy;   Darvocet       Resume series after  Lortab        Delivery  Percocet    Yeast:   Tramadol      Femstat  Tylenol 3      Gyne-lotrimin  Ultram       Monistat  Vicodin           MISC:         All Sunscreens  Hair Coloring/highlights          Insect Repellant's          (Including DEET)         Mystic Tans  

## 2017-03-09 ENCOUNTER — Ambulatory Visit (INDEPENDENT_AMBULATORY_CARE_PROVIDER_SITE_OTHER): Payer: Medicaid Other | Admitting: Obstetrics and Gynecology

## 2017-03-09 ENCOUNTER — Encounter: Payer: Medicaid Other | Admitting: Certified Nurse Midwife

## 2017-03-09 VITALS — BP 106/63 | HR 87 | Wt 145.9 lb

## 2017-03-09 DIAGNOSIS — Z3492 Encounter for supervision of normal pregnancy, unspecified, second trimester: Secondary | ICD-10-CM

## 2017-03-09 LAB — POCT URINALYSIS DIPSTICK
Bilirubin, UA: NEGATIVE
Glucose, UA: NEGATIVE
KETONES UA: NEGATIVE
Nitrite, UA: NEGATIVE
RBC UA: NEGATIVE
SPEC GRAV UA: 1.01 (ref 1.010–1.025)
UROBILINOGEN UA: 0.2 U/dL
pH, UA: 7.5 (ref 5.0–8.0)

## 2017-03-09 NOTE — Progress Notes (Signed)
ROB- pt states she is doing well  

## 2017-03-09 NOTE — Progress Notes (Signed)
ROB-having heartburn daily, TUMS helps. Otherwise doing well, will do three hour glucola as she failed one hour with last pregnancy.

## 2017-03-11 ENCOUNTER — Other Ambulatory Visit: Payer: Self-pay | Admitting: Obstetrics and Gynecology

## 2017-03-11 DIAGNOSIS — R8271 Bacteriuria: Secondary | ICD-10-CM | POA: Insufficient documentation

## 2017-03-11 LAB — URINE CULTURE

## 2017-03-11 MED ORDER — AMOXICILLIN 500 MG PO CAPS: 500.0000 mg | ORAL_CAPSULE | Freq: Three times a day (TID) | ORAL | 2 refills | Status: DC

## 2017-03-30 ENCOUNTER — Encounter: Payer: Medicaid Other | Admitting: Certified Nurse Midwife

## 2017-03-30 ENCOUNTER — Other Ambulatory Visit: Payer: Medicaid Other

## 2017-06-14 ENCOUNTER — Telehealth: Payer: Self-pay | Admitting: Obstetrics and Gynecology

## 2017-06-14 NOTE — Telephone Encounter (Signed)
[name] with [facility] would like to speak with a nurse about pt. Joan Moreno stated that pt contacted their office wanting a week of prenatal care b/c she is 39 weeks and she knows she was a pt here and wanted to get some more info. Please advise. Thanks TNP

## 2017-06-15 ENCOUNTER — Other Ambulatory Visit: Payer: Self-pay | Admitting: Advanced Practice Midwife

## 2017-06-15 DIAGNOSIS — Z3483 Encounter for supervision of other normal pregnancy, third trimester: Secondary | ICD-10-CM

## 2017-06-17 ENCOUNTER — Ambulatory Visit
Admission: RE | Admit: 2017-06-17 | Discharge: 2017-06-17 | Disposition: A | Discharge status: Home / Self Care | Payer: Medicaid Other | Source: Ambulatory Visit | Attending: Advanced Practice Midwife | Admitting: Advanced Practice Midwife

## 2017-06-17 DIAGNOSIS — Z3A39 39 weeks gestation of pregnancy: Secondary | ICD-10-CM | POA: Diagnosis not present

## 2017-06-17 DIAGNOSIS — Z3483 Encounter for supervision of other normal pregnancy, third trimester: Secondary | ICD-10-CM | POA: Diagnosis not present

## 2017-06-22 ENCOUNTER — Inpatient Hospital Stay
Admission: EM | Admit: 2017-06-22 | Discharge: 2017-06-24 | DRG: 775 | Disposition: A | Discharge status: Home / Self Care | Payer: Medicaid Other | Attending: Obstetrics and Gynecology | Admitting: Obstetrics and Gynecology

## 2017-06-22 ENCOUNTER — Inpatient Hospital Stay: Payer: Medicaid Other | Admitting: Certified Registered Nurse Anesthetist

## 2017-06-22 DIAGNOSIS — Z3A40 40 weeks gestation of pregnancy: Secondary | ICD-10-CM | POA: Diagnosis not present

## 2017-06-22 DIAGNOSIS — Z87891 Personal history of nicotine dependence: Secondary | ICD-10-CM

## 2017-06-22 DIAGNOSIS — Z3493 Encounter for supervision of normal pregnancy, unspecified, third trimester: Secondary | ICD-10-CM | POA: Diagnosis present

## 2017-06-22 DIAGNOSIS — O480 Post-term pregnancy: Secondary | ICD-10-CM | POA: Diagnosis present

## 2017-06-22 DIAGNOSIS — O99824 Streptococcus B carrier state complicating childbirth: Secondary | ICD-10-CM | POA: Diagnosis present

## 2017-06-22 HISTORY — DX: Other specified health status: Z78.9

## 2017-06-22 LAB — COMPREHENSIVE METABOLIC PANEL
ALT: 11 U/L — AB (ref 14–54)
AST: 19 U/L (ref 15–41)
Albumin: 3.2 g/dL — ABNORMAL LOW (ref 3.5–5.0)
Alkaline Phosphatase: 229 U/L — ABNORMAL HIGH (ref 38–126)
Anion gap: 9 (ref 5–15)
BUN: 8 mg/dL (ref 6–20)
CHLORIDE: 104 mmol/L (ref 101–111)
CO2: 21 mmol/L — AB (ref 22–32)
CREATININE: 0.42 mg/dL — AB (ref 0.44–1.00)
Calcium: 9 mg/dL (ref 8.9–10.3)
GFR calc non Af Amer: >60 mL/min (ref >60)
Glucose, Bld: 76 mg/dL (ref 65–99)
POTASSIUM: 3.8 mmol/L (ref 3.5–5.1)
SODIUM: 134 mmol/L — AB (ref 135–145)
Total Bilirubin: 0.5 mg/dL (ref 0.3–1.2)
Total Protein: 7 g/dL (ref 6.5–8.1)

## 2017-06-22 LAB — CBC WITH DIFFERENTIAL/PLATELET
BASOS ABS: 0.1 10*3/uL (ref 0–0.1)
Basophils Relative: 0 %
EOS ABS: 0 10*3/uL (ref 0–0.7)
EOS PCT: 0 %
HCT: 34.1 % — ABNORMAL LOW (ref 35.0–47.0)
Hemoglobin: 11.4 g/dL — ABNORMAL LOW (ref 12.0–16.0)
Lymphocytes Relative: 12 %
Lymphs Abs: 1.8 10*3/uL (ref 1.0–3.6)
MCH: 27.9 pg (ref 26.0–34.0)
MCHC: 33.4 g/dL (ref 32.0–36.0)
MCV: 83.7 fL (ref 80.0–100.0)
Monocytes Absolute: 0.9 10*3/uL (ref 0.2–0.9)
Monocytes Relative: 6 %
Neutro Abs: 12.4 10*3/uL — ABNORMAL HIGH (ref 1.4–6.5)
Neutrophils Relative %: 82 %
PLATELETS: 158 10*3/uL (ref 150–440)
RBC: 4.07 MIL/uL (ref 3.80–5.20)
RDW: 13 % (ref 11.5–14.5)
WBC: 15.2 10*3/uL — AB (ref 3.6–11.0)

## 2017-06-22 LAB — CHLAMYDIA/NGC RT PCR (ARMC ONLY)
Chlamydia Tr: NOT DETECTED
N GONORRHOEAE: NOT DETECTED

## 2017-06-22 LAB — RAPID HIV SCREEN (HIV 1/2 AB+AG)
HIV 1/2 ANTIBODIES: NONREACTIVE
HIV-1 P24 ANTIGEN - HIV24: NONREACTIVE

## 2017-06-22 LAB — TYPE AND SCREEN
ABO/RH(D): B POS
Antibody Screen: NEGATIVE

## 2017-06-22 MED ORDER — PRENATAL MULTIVITAMIN CH
1.0000 | ORAL_TABLET | Freq: Every day | ORAL | Status: DC
  Administered 2017-06-23: 1 via ORAL
  Filled 2017-06-22: qty 1

## 2017-06-22 MED ORDER — ONDANSETRON HCL 4 MG PO TABS: 4.0000 mg | ORAL_TABLET | ORAL | Status: DC | PRN

## 2017-06-22 MED ORDER — SOD CITRATE-CITRIC ACID 500-334 MG/5ML PO SOLN: 30.0000 mL | ORAL | Status: DC | PRN

## 2017-06-22 MED ORDER — LIDOCAINE-EPINEPHRINE (PF) 1.5 %-1:200000 IJ SOLN
INTRAMUSCULAR | Status: DC | PRN
  Administered 2017-06-22: 3 mL via EPIDURAL
  Administered 2017-06-22: 3 mL via PERINEURAL

## 2017-06-22 MED ORDER — SODIUM CHLORIDE 0.9 % IV SOLN
2.0000 g | Freq: Once | INTRAVENOUS | Status: AC
  Administered 2017-06-22: 2 g via INTRAVENOUS
  Filled 2017-06-22: qty 2000

## 2017-06-22 MED ORDER — COCONUT OIL OIL: 1.0000 "application " | TOPICAL_OIL | Status: DC | PRN

## 2017-06-22 MED ORDER — SIMETHICONE 80 MG PO CHEW: 80.0000 mg | CHEWABLE_TABLET | ORAL | Status: DC | PRN

## 2017-06-22 MED ORDER — FENTANYL CITRATE (PF) 100 MCG/2ML IJ SOLN: 50.0000 ug | INTRAMUSCULAR | Status: DC | PRN

## 2017-06-22 MED ORDER — SODIUM CHLORIDE 0.9 % IV SOLN
INTRAVENOUS | Status: DC | PRN
  Administered 2017-06-22 (×2): 5 mL via EPIDURAL

## 2017-06-22 MED ORDER — HYDRALAZINE HCL 20 MG/ML IJ SOLN: 10.0000 mg | Freq: Once | INTRAMUSCULAR | Status: DC | PRN

## 2017-06-22 MED ORDER — BENZOCAINE-MENTHOL 20-0.5 % EX AERO: 1.0000 "application " | INHALATION_SPRAY | CUTANEOUS | Status: DC | PRN

## 2017-06-22 MED ORDER — PHENYLEPHRINE 40 MCG/ML (10ML) SYRINGE FOR IV PUSH (FOR BLOOD PRESSURE SUPPORT)
80.0000 ug | PREFILLED_SYRINGE | INTRAVENOUS | Status: DC | PRN
  Filled 2017-06-22: qty 5

## 2017-06-22 MED ORDER — SENNOSIDES-DOCUSATE SODIUM 8.6-50 MG PO TABS
2.0000 | ORAL_TABLET | ORAL | Status: DC
  Administered 2017-06-23 – 2017-06-24 (×2): 2 via ORAL
  Filled 2017-06-22 (×2): qty 2

## 2017-06-22 MED ORDER — FLEET ENEMA 7-19 GM/118ML RE ENEM: 1.0000 | ENEMA | RECTAL | Status: DC | PRN

## 2017-06-22 MED ORDER — IBUPROFEN 600 MG PO TABS
600.0000 mg | ORAL_TABLET | Freq: Four times a day (QID) | ORAL | Status: DC
  Administered 2017-06-22 – 2017-06-24 (×7): 600 mg via ORAL
  Filled 2017-06-22 (×6): qty 1

## 2017-06-22 MED ORDER — ZOLPIDEM TARTRATE 5 MG PO TABS: 5.0000 mg | ORAL_TABLET | Freq: Every evening | ORAL | Status: DC | PRN

## 2017-06-22 MED ORDER — IBUPROFEN 600 MG PO TABS
ORAL_TABLET | ORAL | Status: AC
Start: 2017-06-22 — End: 2017-06-22
  Administered 2017-06-22: 600 mg via ORAL
  Filled 2017-06-22: qty 1

## 2017-06-22 MED ORDER — ONDANSETRON HCL 4 MG/2ML IJ SOLN: 4.0000 mg | INTRAMUSCULAR | Status: DC | PRN

## 2017-06-22 MED ORDER — BUPIVACAINE HCL (PF) 0.25 % IJ SOLN
INTRAMUSCULAR | Status: DC | PRN
  Administered 2017-06-22 (×2): 3 mL via EPIDURAL

## 2017-06-22 MED ORDER — DIBUCAINE 1 % RE OINT: 1.0000 "application " | TOPICAL_OINTMENT | RECTAL | Status: DC | PRN

## 2017-06-22 MED ORDER — FENTANYL 2.5 MCG/ML W/ROPIVACAINE 0.15% IN NS 100 ML EPIDURAL (ARMC): 12.0000 mL/h | EPIDURAL | Status: DC

## 2017-06-22 MED ORDER — LACTATED RINGERS IV SOLN
INTRAVENOUS | Status: DC
  Administered 2017-06-22: 17:00:00 via INTRAVENOUS
  Administered 2017-06-22: 500 mL via INTRAVENOUS
  Administered 2017-06-22: 15:00:00 via INTRAVENOUS

## 2017-06-22 MED ORDER — LIDOCAINE HCL (PF) 1 % IJ SOLN
INTRAMUSCULAR | Status: DC | PRN
  Administered 2017-06-22: 2 mL via SUBCUTANEOUS
  Administered 2017-06-22: 3 mL

## 2017-06-22 MED ORDER — ACETAMINOPHEN 325 MG PO TABS: 650.0000 mg | ORAL_TABLET | ORAL | Status: DC | PRN

## 2017-06-22 MED ORDER — EPHEDRINE 5 MG/ML INJ
10.0000 mg | INTRAVENOUS | Status: DC | PRN
  Filled 2017-06-22: qty 2

## 2017-06-22 MED ORDER — FENTANYL 2.5 MCG/ML W/ROPIVACAINE 0.15% IN NS 100 ML EPIDURAL (ARMC)
EPIDURAL | Status: AC
  Filled 2017-06-22: qty 100

## 2017-06-22 MED ORDER — FENTANYL 2.5 MCG/ML W/ROPIVACAINE 0.15% IN NS 100 ML EPIDURAL (ARMC)
EPIDURAL | Status: DC | PRN
  Administered 2017-06-22: 12 mL/h via EPIDURAL

## 2017-06-22 MED ORDER — DIPHENHYDRAMINE HCL 50 MG/ML IJ SOLN: 12.5000 mg | INTRAMUSCULAR | Status: DC | PRN

## 2017-06-22 MED ORDER — DIPHENHYDRAMINE HCL 25 MG PO CAPS: 25.0000 mg | ORAL_CAPSULE | Freq: Four times a day (QID) | ORAL | Status: DC | PRN

## 2017-06-22 MED ORDER — WITCH HAZEL-GLYCERIN EX PADS: 1.0000 "application " | MEDICATED_PAD | CUTANEOUS | Status: DC | PRN

## 2017-06-22 MED ORDER — LACTATED RINGERS IV SOLN
500.0000 mL | INTRAVENOUS | Status: DC | PRN
  Administered 2017-06-22: 250 mL via INTRAVENOUS

## 2017-06-22 MED ORDER — OXYTOCIN 40 UNITS IN LACTATED RINGERS INFUSION - SIMPLE MED
2.5000 [IU]/h | INTRAVENOUS | Status: DC
  Administered 2017-06-22: 2.5 [IU]/h via INTRAVENOUS
  Filled 2017-06-22: qty 1000

## 2017-06-22 MED ORDER — OXYTOCIN BOLUS FROM INFUSION
500.0000 mL | Freq: Once | INTRAVENOUS | Status: AC
  Administered 2017-06-22: 500 mL via INTRAVENOUS

## 2017-06-22 MED ORDER — LABETALOL HCL 5 MG/ML IV SOLN: 20.0000 mg | INTRAVENOUS | Status: DC | PRN

## 2017-06-22 MED ORDER — SENNOSIDES-DOCUSATE SODIUM 8.6-50 MG PO TABS: 2.0000 | ORAL_TABLET | ORAL | Status: DC

## 2017-06-22 MED ORDER — LACTATED RINGERS IV SOLN: 500.0000 mL | Freq: Once | INTRAVENOUS | Status: DC

## 2017-06-22 MED ORDER — LIDOCAINE HCL (PF) 1 % IJ SOLN: 30.0000 mL | INTRAMUSCULAR | Status: DC | PRN

## 2017-06-22 MED ORDER — SODIUM CHLORIDE 0.9 % IV SOLN
1.0000 g | INTRAVENOUS | Status: DC
  Administered 2017-06-22: 1 g via INTRAVENOUS
  Filled 2017-06-22 (×5): qty 1000

## 2017-06-22 MED ORDER — ONDANSETRON HCL 4 MG/2ML IJ SOLN: 4.0000 mg | Freq: Four times a day (QID) | INTRAMUSCULAR | Status: DC | PRN

## 2017-06-22 NOTE — Progress Notes (Signed)
Joan [place]ontoSav[place] A Moreno is a 26 y.o. G3P1011 at 6822w1d by LMP admitted for active labor  Subjective: Rates pain a 8 on pain scale since epidural placed 40 minutes ago.  Objective: BP (!) 104/54   Pulse 77   Temp 97.9 F (36.6 C) (Oral)   Resp 16   Ht 5\' 5"  (1.651 m)   Wt 150 lb (68 kg)   LMP 08/27/2016   SpO2 98%   BMI 24.96 kg/m  No intake/output data recorded. No intake/output data recorded.  FHT:  FHR: 138 bpm, variability: moderate,  accelerations:  Present,  decelerations:  Present spontaneous prolonged decels x 2 minutes with return to baseling after IVF bolus and left lateral positioning. UC:   irregular, every 1-4 minutes, strong to palpation SVE:   Dilation: 8 Effacement (%): 90 Station: -2 Exam by:: T.Hall, RN  Labs: Lab Results  Component Value Date   WBC 15.2 (H) 06/22/2017   HGB 11.4 (L) 06/22/2017   HCT 34.1 (L) 06/22/2017   MCV 83.7 06/22/2017   PLT 158 06/22/2017    Assessment / Plan: Spontaneous labor, progressing normally  Labor: Progressing normally Preeclampsia:  labs stable Fetal Wellbeing:  Category I Pain Control:  Epidural I/D:  n/a Anticipated MOD:  NSVD  Joan Moreno 06/22/2017, 5:48 PM

## 2017-06-22 NOTE — Anesthesia Procedure Notes (Signed)
Epidural Patient location during procedure: OB Start time: 06/22/2017 4:37 PM End time: 06/22/2017 5:00 PM  Staffing Anesthesiologist: Lenard SimmerKARENZ, ANDREW Resident/CRNA: Ginger CarneMICHELET, Shawonda Kerce Performed: resident/CRNA   Preanesthetic Checklist Completed: patient identified, site marked, surgical consent, pre-op evaluation, timeout performed, IV checked, risks and benefits discussed and monitors and equipment checked  Epidural Patient position: sitting Prep: Betadine Patient monitoring: heart rate, continuous pulse ox and blood pressure Approach: midline Location: L4-L5 Injection technique: LOR saline  Needle:  Needle type: Tuohy  Needle gauge: 17 G Needle length: 9 cm and 9 Needle insertion depth: 6 cm Catheter type: closed end flexible Catheter size: 19 Gauge Catheter at skin depth: 11 cm Test dose: negative and 1.5% lidocaine with Epi 1:200 K  Assessment Sensory level: T10 Events: blood not aspirated, injection not painful, no injection resistance, negative IV test and no paresthesia  Additional Notes Pt. Evaluated and documentation done after procedure finished. Patient identified. Risks/Benefits/Options discussed with patient including but not limited to bleeding, infection, nerve damage, paralysis, failed block, incomplete pain control, headache, blood pressure changes, nausea, vomiting, reactions to medication both or allergic, itching and postpartum back pain. Confirmed with bedside nurse the patient's most recent platelet count. Confirmed with patient that they are not currently taking any anticoagulation, have any bleeding history or any family history of bleeding disorders. Patient expressed understanding and wished to proceed. All questions were answered. Sterile technique was used throughout the entire procedure. Please see nursing notes for vital signs. Test dose was given through epidural catheter and negative prior to continuing to dose epidural or start infusion. Warning  signs of high block given to the patient including shortness of breath, tingling/numbness in hands, complete motor block, or any concerning symptoms with instructions to call for help. Patient was given instructions on fall risk and not to get out of bed. All questions and concerns addressed with instructions to call with any issues or inadequate analgesia.   Patient tolerated the insertion well without immediate complications.Reason for block:procedure for pain

## 2017-06-22 NOTE — H&P (Signed)
Obstetric History and Physical  Gar PontoSavanna A Enfield is a 26 y.o. G3P1011 with IUP at 5790w1d presenting with regular contractions. Patient states she has been having  regular, every 2-3 minutes contractions, minimal vaginal bleeding, intact membranes, with active fetal movement.    [facility]  Pregnancy complications or risks:none  Prenatal labs and studies: ABO, Rh: B/Positive/-- (02/06 1522) Antibody: Negative (02/06 1522) Rubella: 2.66 (02/06 1522) RPR: Reactive, Reactive (02/06 1522)  HBsAg: Negative (02/06 1522)  HIV:    GBS: positive in urine 1 hr Glucola  normal Genetic screening normal Anatomy US normal  Past Medical History:  Diagnosis Date  . Medical history non-contributory   . Personal history of asthma    as a child    Past Surgical History:  Procedure Laterality Date  . broke leg     put to sleep to put back in place  . TONSILLECTOMY      OB History  Gravida Para Term Preterm AB Living  3 1 1   1 1   SAB TAB Ectopic Multiple Live Births  1       1    # Outcome Date GA Lbr Len/2nd Weight Sex Delivery Anes PTL Lv  3 Current           2 Term 04/15/16 9186w1d  7 lb 5.5 oz (3.331 kg) F Vag-Spont EPI  LIV  1 SAB 04/2015        FD      Social History   Social History  . Marital status: Single    Spouse name: N/A  . Number of children: N/A  . Years of education: N/A   Occupational History  . unemployed    Social History Main Topics  . Smoking status: Former Smoker    Packs/day: 0.50    Years: 0.50    Types: Cigarettes    Quit date: 10/06/2014  . Smokeless tobacco: Never Used  . Alcohol use No  . Drug use: No  . Sexual activity: Yes    Partners: Male    Birth control/ protection: None, Surgical     Comment: Tubal to be done later   Other Topics Concern  . None   Social History Narrative  . None    Family History  Problem Relation Age of Onset  . Diabetes Mother        borderline  . Diabetes Maternal Grandfather         aunt, uncle    Prescriptions Prior to Admission  Medication Sig Dispense Refill Last Dose  . Prenatal Vit-Fe Fumarate-FA (MULTIVITAMIN-PRENATAL) 27-0.8 MG TABS tablet Take 1 tablet by mouth daily at 12 noon.   06/22/2017 at Unknown time  . amoxicillin (AMOXIL) 500 MG capsule Take 1 capsule (500 mg total) by mouth 3 (three) times daily. (Patient not taking: Reported on 06/22/2017) 21 capsule 2 Completed Course at Unknown time    Allergies  Allergen Reactions  . Stadol [Butorphanol]     Pt felt like she couldn't catch her breathe and felt like she was having a panic attack  . Sulfa Antibiotics Hives and Rash    Review of Systems: Negative except for what is mentioned in HPI.  Physical Exam: BP 136/84 (BP Location: Left Arm)   Pulse 81   Temp 97.9 F (36.6 C) (Oral)   Resp 16   Ht 5\' 5"  (1.651 m)   Wt 150 lb (68 kg)   LMP 08/27/2016   BMI 24.96 kg/m  GENERAL:  Well-developed, well-nourished female in no acute distress.  LUNGS: Clear to auscultation bilaterally.  HEART: Regular rate and rhythm. ABDOMEN: Soft, nontender, nondistended, gravid. EXTREMITIES: Nontender, no edema, 2+ distal pulses. Cervical Exam: Dilation: 6 Effacement (%): 90 Cervical Position: Middle Station: -2 Presentation: Vertex Exam by:: Mindy Trogdon RN FHT:  Baseline rate 144 bpm   Variability moderate  Accelerations present   Decelerations none Contractions: Every 2-3 mins   Pertinent Labs/Studies:   No results found for this or any previous visit (from the past 24 hour(s)).  Assessment : TERESHA HANKS is a 26 y.o. G3P1011 at [redacted]w[redacted]d being admitted for labor.  Plan: Labor: Expectant management.  Induction/Augmentation as needed, per protocol, desires epidural FWB: Reassuring fetal heart tracing.  GBS positive Delivery plan: Hopeful for vaginal delivery  Yanis Juma, CNM [facility], [facility]

## 2017-06-22 NOTE — Anesthesia Preprocedure Evaluation (Signed)
Anesthesia Evaluation  Patient identified by MRN, date of birth, ID band Patient awake    Reviewed: Allergy & Precautions, H&P , NPO status , Patient's Chart, lab work & pertinent test results  Airway Mallampati: II   Neck ROM: full    Dental  (+) Teeth Intact   Pulmonary neg pulmonary ROS, former smoker,    Pulmonary exam normal        Cardiovascular Exercise Tolerance: Good negative cardio ROS Normal cardiovascular exam     Neuro/Psych    GI/Hepatic negative GI ROS, Neg liver ROS,   Endo/Other  negative endocrine ROS  Renal/GU negative Renal ROS     Musculoskeletal   Abdominal   Peds  Hematology negative hematology ROS (+)   Anesthesia Other Findings   Reproductive/Obstetrics (+) Pregnancy                             Anesthesia Physical Anesthesia Plan  ASA: II  Anesthesia Plan: Epidural   Post-op Pain Management:    Induction:   PONV Risk Score and Plan:   Airway Management Planned:   Additional Equipment:   Intra-op Plan:   Post-operative Plan:   Informed Consent:   Dental Advisory Given  Plan Discussed with:   Anesthesia Plan Comments:         Anesthesia Quick Evaluation

## 2017-06-22 NOTE — Anesthesia Procedure Notes (Signed)
Epidural Patient location during procedure: OB Start time: 06/22/2017 6:12 PM End time: 06/22/2017 6:20 PM  Staffing Anesthesiologist: Lenard SimmerKARENZ, Kearstin Learn Performed: anesthesiologist   Preanesthetic Checklist Completed: patient identified, site marked, surgical consent, pre-op evaluation, timeout performed, IV checked, risks and benefits discussed and monitors and equipment checked  Epidural Patient position: sitting Prep: ChloraPrep Patient monitoring: heart rate, continuous pulse ox and blood pressure Approach: midline Location: L3-L4 Injection technique: LOR saline  Needle:  Needle type: Tuohy  Needle gauge: 17 G Needle length: 9 cm and 9 Needle insertion depth: 5 cm Catheter type: closed end flexible Catheter size: 19 Gauge Catheter at skin depth: 9 cm Test dose: negative and 1.5% lidocaine with Epi 1:200 K  Assessment Sensory level: T10 Events: blood not aspirated, injection not painful, no injection resistance, negative IV test and no paresthesia  Additional Notes Pt. Evaluated and documentation done after procedure finished. Patient identified. Risks/Benefits/Options discussed with patient including but not limited to bleeding, infection, nerve damage, paralysis, failed block, incomplete pain control, headache, blood pressure changes, nausea, vomiting, reactions to medication both or allergic, itching and postpartum back pain. Confirmed with bedside nurse the patient's most recent platelet count. Confirmed with patient that they are not currently taking any anticoagulation, have any bleeding history or any family history of bleeding disorders. Patient expressed understanding and wished to proceed. All questions were answered. Sterile technique was used throughout the entire procedure. Please see nursing notes for vital signs. Test dose was given through epidural catheter and negative prior to continuing to dose epidural or start infusion. Warning signs of high block given to the  patient including shortness of breath, tingling/numbness in hands, complete motor block, or any concerning symptoms with instructions to call for help. Patient was given instructions on fall risk and not to get out of bed. All questions and concerns addressed with instructions to call with any issues or inadequate analgesia.   Patient tolerated the insertion well without immediate complications.Reason for block:procedure for pain

## 2017-06-22 NOTE — OB Triage Note (Signed)
Pt is a G3P1 40.1 that presents from the ED c/o ctx that started around 0530 this am. Denies VB, LOF. +FM.

## 2017-06-23 LAB — CBC
HEMATOCRIT: 28.3 % — AB (ref 35.0–47.0)
Hemoglobin: 9.6 g/dL — ABNORMAL LOW (ref 12.0–16.0)
MCH: 28.9 pg (ref 26.0–34.0)
MCHC: 34 g/dL (ref 32.0–36.0)
MCV: 84.9 fL (ref 80.0–100.0)
Platelets: 130 10*3/uL — ABNORMAL LOW (ref 150–440)
RBC: 3.33 MIL/uL — AB (ref 3.80–5.20)
RDW: 13 % (ref 11.5–14.5)
WBC: 13.5 10*3/uL — AB (ref 3.6–11.0)

## 2017-06-23 LAB — RPR: RPR Ser Ql: NONREACTIVE

## 2017-06-23 NOTE — Anesthesia Postprocedure Evaluation (Signed)
Anesthesia Post Note  Patient: Joan Moreno  Procedure(s) Performed: * No procedures listed *  Patient location during evaluation: Mother Baby Anesthesia Type: Epidural Level of consciousness: awake and alert and oriented Pain management: satisfactory to patient Vital Signs Assessment: post-procedure vital signs reviewed and stable Respiratory status: respiratory function stable Cardiovascular status: stable Postop Assessment: no headache, no backache, no signs of nausea or vomiting, patient able to bend at knees, epidural receding and adequate PO intake Anesthetic complications: no     Last Vitals:  Vitals:   06/22/17 2241 06/23/17 0310  BP: (!) 106/57 (!) 106/56  Pulse: (!) 108 81  Resp: 20 20  Temp: 36.8 C 36.8 C  SpO2: 100%     Last Pain:  Vitals:   06/23/17 0310  TempSrc: Oral  PainSc:                  Clydene PughBeane, Davis Ambrosini D

## 2017-06-23 NOTE — Progress Notes (Signed)
Post Partum Day 1 Subjective: no complaints, up ad lib and voiding  Objective: Blood pressure 119/62, pulse 80, temperature 98.2 F (36.8 C), temperature source Oral, resp. rate 20, height 5\' 5"  (1.651 m), weight 150 lb (68 kg), last menstrual period 08/27/2016, SpO2 99 %, unknown if currently breastfeeding.  Physical Exam:  General: alert, cooperative and appears stated age Lochia: appropriate Uterine Fundus: firm DVT Evaluation: No evidence of DVT seen on physical exam. Negative Homan's sign.   Recent Labs  06/22/17 1440 06/23/17 0519  HGB 11.4* 9.6*  HCT 34.1* 28.3*    Assessment/Plan: Plan for discharge tomorrow and Contraception desires PPTL- signe medicaid papers 2 weeks ago, will set up as outpatient surgery with Dr Joan Moreno Infant feeding  Bottle    LOS: 1 day   Joan Moreno N Joan Moreno 06/23/2017, 7:45 PM

## 2017-06-24 MED ORDER — MEDROXYPROGESTERONE ACETATE 150 MG/ML IM SUSP
150.0000 mg | Freq: Once | INTRAMUSCULAR | Status: AC
  Administered 2017-06-24: 150 mg via INTRAMUSCULAR
  Filled 2017-06-24: qty 1

## 2017-06-24 MED ORDER — VARICELLA VIRUS VACCINE LIVE 1350 PFU/0.5ML IJ SUSR
0.5000 mL | Freq: Once | INTRAMUSCULAR | Status: AC
  Administered 2017-06-24: 0.5 mL via SUBCUTANEOUS
  Filled 2017-06-24: qty 0.5

## 2017-06-24 MED ORDER — IBUPROFEN 600 MG PO TABS: 600.0000 mg | ORAL_TABLET | Freq: Four times a day (QID) | ORAL | 0 refills | Status: DC

## 2017-06-24 NOTE — Discharge Summary (Signed)
Physician Obstetric Discharge Summary  Patient ID: Joan Moreno MRN: [id number] DOB/AGE: 11-18-1990 26 y.o.   Date of Admission: 06/22/2017 Melody Aura Camps, CNM [facility] Marshall, MD)  Date of Discharge: 06/24/2017 Serafina Royals, CNM [facility], MD)  Admitting Diagnosis: Onset of Labor at [redacted]w[redacted]d  Secondary Diagnosis: None  Mode of Delivery: normal spontaneous vaginal delivery     Discharge Diagnosis: No other diagnosis   Intrapartum Procedures: epidural   Post partum procedures: None  Complications: none   Brief Hospital Course  Joan Moreno is a [id number] who had a SVD on 06/22/2017;  for further details of this birth, please refer to the delivey note.  Patient had an uncomplicated postpartum course.  By time of discharge on PPD#2, her pain was controlled on oral pain medications; she had appropriate lochia and was ambulating, voiding without difficulty and tolerating regular diet.  She was deemed stable for discharge to home.    Labs:  CBC Latest Ref Rng & Units 06/23/2017 06/22/2017 11/23/2016  WBC 3.6 - 11.0 K/uL 13.5(H) 15.2(H) 9.9  Hemoglobin 12.0 - 16.0 g/dL 6.2(Z) 11.4(L) 12.5  Hematocrit 35.0 - 47.0 % 28.3(L) 34.1(L) 39.5  Platelets 150 - 440 K/uL 130(L) 158 226   B POS  Physical exam:   BP 103/68 (BP Location: Right Arm)   Pulse 71   Temp 97.7 F (36.5 C) (Oral)   Resp 18   Ht  (1.651 m)   Wt 150 lb (68 kg)   LMP 08/27/2016   SpO2 100%   Breastfeeding? Unknown   BMI 24.96 kg/m   General: alert and no distress  Lochia: appropriate  Abdomen: soft, NT  Uterine Fundus: firm  Perineum: Intact  Extremities: No evidence of DVT seen on physical exam. No lower extremity edema.  Discharge Instructions: Per After Visit Summary.  Activity: Advance as tolerated. Pelvic rest for 6 weeks.  Also refer to After Visit Summary  Diet: Regular  Medications:  Allergies as of 06/24/2017      Reactions   Stadol [butorphanol]    Pt felt like she couldn't catch  her breathe and felt like she was having a panic attack   Sulfa Antibiotics Hives, Rash      Medication List    STOP taking these medications   amoxicillin 500 MG capsule Commonly known as:  AMOXIL     TAKE these medications   ibuprofen 600 MG tablet Commonly known as:  ADVIL,MOTRIN Take 1 tablet (600 mg total) by mouth every 6 (six) hours.   multivitamin-prenatal 27-0.8 MG Tabs tablet Take 1 tablet by mouth daily at 12 noon.            Discharge Care Instructions        Start     Ordered   06/24/17 0000  ibuprofen (ADVIL,MOTRIN) 600 MG tablet  Every 6 hours    Question:  Supervising Provider  Answer:  Hildred Laser   06/24/17 0750     Outpatient follow up:  Follow-up Information    Hildred Laser, MD. Schedule an appointment as soon as possible for a visit on 07/12/2017.   Specialties:  Obstetrics and Gynecology, Radiology Why:  Pre-Op BTL 11:15 am Contact information: [address] [place] [address] [phone]        Purcell Nails, CNM. Go on 07/22/2017.   Specialties:  Obstetrics and Gynecology, Radiology Why:  Post partum visit at 10:30am Contact information: [address] [place] [address] [phone]  Postpartum contraception: plans PPBTL  Discharged Condition: stable  Discharged to: home   Newborn Data:  Disposition:home with mother  Apgars: APGAR (1 MIN): 8   APGAR (5 MINS): 9      Baby Feeding: Bottle   Joan Moreno, CNM

## 2017-06-24 NOTE — Progress Notes (Signed)
Patient discharge to home via wheelchair with spouse and baby in car seat.  

## 2017-07-12 ENCOUNTER — Encounter: Payer: Self-pay | Admitting: Obstetrics and Gynecology

## 2017-07-12 ENCOUNTER — Ambulatory Visit (INDEPENDENT_AMBULATORY_CARE_PROVIDER_SITE_OTHER): Payer: Medicaid Other | Admitting: Obstetrics and Gynecology

## 2017-07-12 VITALS — BP 104/68 | HR 73 | Ht 66.0 in | Wt 140.0 lb

## 2017-07-12 DIAGNOSIS — Z308 Encounter for other contraceptive management: Secondary | ICD-10-CM

## 2017-07-12 NOTE — Progress Notes (Signed)
    GYNECOLOGY PROGRESS NOTE  Subjective:    Patient ID: Joan Moreno, female    DOB: 06/28/1991, 26 y.o.   MRN: [id number]  HPI  Patient is a 26 y.o. G62P2012 female who is 2 weeks postpartum s/p SVD presenting for discussion for interval tubal ligation.  Denies complaints today.  Vaginal delivery was uncomplicated.   The following portions of the patient's history were reviewed and updated as appropriate: allergies, current medications, past family history, past medical history, past social history, past surgical history and problem list.  Review of Systems A comprehensive review of systems was negative.   Objective:   Blood pressure 104/68, pulse 73, height  (1.676 m), weight 140 lb (63.5 kg), currently breastfeeding. General appearance: alert and no distress Remainder of exam deferred.    Assessment:   Tubal ligation counseling S/p SVD  Plan:   Patient desires permanent sterilization.  Other reversible forms of contraception were discussed with patient; she declines all other modalities. Risks of procedure discussed with patient including but not limited to: risk of regret, permanence of method, bleeding, infection, injury to surrounding organs and need for additional procedures.  Failure risk of 1-2 % with increased risk of ectopic gestation if pregnancy occurs was also discussed with patient.  Patient verbalized understanding of these risks and wants to proceed with sterilization.  To schedule surgery after 6 week postpartum visit (08/15/2017).  Has postpartum visit scheduled in 4 weeks. Pre-op with MD in 3 weeks.    A total of 15 minutes were spent face-to-face with the patient during this encounter and over half of that time dealt with counseling and coordination of care.   Hildred Laser, MD [facility]

## 2017-07-12 NOTE — Patient Instructions (Addendum)
Laparoscopic Tubal Ligation Laparoscopic tubal ligation is a procedure to close the fallopian tubes. This is done so that you cannot get pregnant. When the fallopian tubes are closed, the eggs that your ovaries release cannot enter the uterus, and sperm cannot reach the released eggs. A laparoscopic tubal ligation is sometimes called "getting your tubes tied." You should not have this procedure if you want to get pregnant someday or if you are unsure about having more children. Tell a health care provider about:  Any allergies you have.  All medicines you are taking, including vitamins, herbs, eye drops, creams, and over-the-counter medicines.  Any problems you or family members have had with anesthetic medicines.  Any blood disorders you have.  Any surgeries you have had.  Any medical conditions you have.  Whether you are pregnant or may be pregnant.  Any past pregnancies. What are the risks? Generally, this is a safe procedure. However, problems may occur, including:  Infection.  Bleeding.  Injury to surrounding organs.  Side effects from anesthetics.  Failure of the procedure.  This procedure can increase your risk of a kind of pregnancy in which a fertilized egg attaches to the outside of the uterus (ectopic pregnancy). What happens before the procedure?  Ask your health care provider about: ? Changing or stopping your regular medicines. This is especially important if you are taking diabetes medicines or blood thinners. ? Taking medicines such as aspirin and ibuprofen. These medicines can thin your blood. Do not take these medicines before your procedure if your health care provider instructs you not to.  Follow instructions from your health care provider about eating and drinking restrictions.  Plan to have someone take you home after the procedure.  If you go home right after the procedure, plan to have someone with you for 24 hours. What happens during the  procedure?  You will be given one or more of the following: ? A medicine to help you relax (sedative). ? A medicine to numb the area (local anesthetic). ? A medicine to make you fall asleep (general anesthetic). ? A medicine that is injected into an area of your body to numb everything below the injection site (regional anesthetic).  An IV tube will be inserted into one of your veins. It will be used to give you medicines and fluids during the procedure.  Your bladder may be emptied with a small tube (catheter).  If you have been given a general anesthetic, a tube will be put down your throat to help you breathe.  Two small cuts (incisions) will be made in your lower abdomen and near your belly button.  Your abdomen will be inflated with a gas. This will let the surgeon see better and will give the surgeon room to work.  A thin, lighted tube (laparoscope) with a camera attached will be inserted into your abdomen through one of the incisions. Small instruments will be inserted through the other incision.  The fallopian tubes will be tied off, burned (cauterized), or blocked with a clip, ring, or clamp. A small portion in the center of each fallopian tube may be removed.  The gas will be released from the abdomen.  The incisions will be closed with stitches (sutures).  A bandage (dressing) will be placed over the incisions. The procedure may vary among health care providers and hospitals. What happens after the procedure?  Your blood pressure, heart rate, breathing rate, and blood oxygen level will be monitored often until the medicines you   were given have worn off.  You will be given medicine to help with pain, nausea, and vomiting as needed. This information is not intended to replace advice given to you by your health care provider. Make sure you discuss any questions you have with your health care provider. Document Released: 01/10/2001 Document Revised: 03/11/2016 Document  Reviewed: 09/14/2015 [employer] Interactive Patient Education  2018 [employer]  

## 2017-07-22 ENCOUNTER — Encounter: Payer: Self-pay | Admitting: Obstetrics and Gynecology

## 2017-08-03 ENCOUNTER — Ambulatory Visit (INDEPENDENT_AMBULATORY_CARE_PROVIDER_SITE_OTHER): Payer: Medicaid Other | Admitting: Obstetrics and Gynecology

## 2017-08-03 VITALS — BP 111/80 | HR 88 | Ht 66.0 in | Wt 144.2 lb

## 2017-08-03 DIAGNOSIS — Z641 Problems related to multiparity: Secondary | ICD-10-CM

## 2017-08-03 DIAGNOSIS — Z01818 Encounter for other preprocedural examination: Secondary | ICD-10-CM

## 2017-08-03 NOTE — Patient Instructions (Addendum)
You are scheduled for surgery on 08/15/2017.  Nothing to eat after midnight on day prior to surgery.  Do not take any medications unless recommended by your provider on day prior to surgery.  Do not take NSAIDs (Motrin, Aleve) or aspirin 7 days prior to surgery.  You may take Tylenol products for minor aches and pains.  You will receive a prescription for pain medications post-operatively.  You will be contacted by phone approximately 1 week prior to surgery to schedule pre-operative appointment.  Please call the office if you have any questions regarding your upcoming surgery.    Safety Before and After Surgery Your health care providers, including your surgical team, take many precautions to keep you safe during surgery. This sheet explains the steps that your health care providers take to prevent surgical error and to reduce the risk of complications. It also lists some things that you, your friends, and your family members can do to ensure a safe surgery. What is surgical error? Surgical error is when a mistake is made during a procedure. Types of surgical error include:  Operating on the wrong person.  Operating on the wrong body part.  Performing the wrong operation.  Making a medicine mistake.  Making a surgical mistake.  What is a complication? A complication is a change in the normal surgical plan of care. Complications are known risks that, while rare, can occur during or after surgery. Examples of complications include:  Infection.  Excess bleeding.  Inhaling food or liquids from your stomach into your lungs (aspiration).  Allergic reaction to medicines.  Review all risks and complications with your health care provider as part of the informed consent procedure before your surgery. Ask questions if you do not understand something. What steps are taken to prevent surgical error and complications? Your health care providers, including your surgical team, take many steps  before and during a procedure to reduce the risk of error and complication. These steps include:  Placing an identification bracelet on your wrist and checking it often.  Checking the surgical consent form to make sure it is correct.  Reviewing the surgical consent form with you to make sure that you understand the procedure.  Reviewing your medicines and history of allergies to prevent drug reactions and interactions.  Marking the surgical site before the procedure.  Washing and disinfecting the surgical site before the procedure to prevent infection.  Giving you a list of safety steps to take before surgery. This usually includes what foods or medicines to avoid and any lifestyle changes you should make before the procedure, such as quitting smoking.  How can I help to promote a safe surgery? One way you can help to prevent surgical error is by choosing a surgeon and facility that is right for you. To do this:  Ask how many times your surgeon has performed the surgery you will be having.  Ask how often the surgery is done in the hospital where you will have the procedure.  Choose a hospital that is licensed, accredited, and has a system in place in case of an emergency.  Here are some other steps you can take:  Talk to your anesthesia provider about your medicines, allergies, any drug use, and any previous drug or anesthesia reactions you have had. Be honest.  Review the surgical consent form. Make sure you understand it completely.  Mark your surgical site. This will be done just before surgery. Your health care provider will give you a special  marker to use. Make sure it is the correct area for surgery.  Do not eat any food or drink before surgery if your surgeon has instructed you not to.  Keep the surgical area clean. Follow instructions if you are asked to shower with an antibacterial soap.  Follow all of your home care instructions. Wash your hands often, and especially  before bandage (dressing) changes.  How can my friends or family members promote a safe surgery? Friends and family can help by:  Going with you to your medical appointments before your surgery to make sure you understand the procedure.  Reviewing the safety steps that you need to take before surgery and making sure that you follow them.  Knowing what surgery you are scheduled to have.  Double-checking that the correct body part is marked before surgery.  Going with you to the procedure.  Advocating for you on your behalf if you cannot do so.  Following the home care instructions for after the surgery to help you heal well.  Questions to ask before surgery 1. Why do I need surgery? Is another treatment available? 2. What are the risks and benefits of the surgery? 3. Do I have a choice of anesthesia? 4. What should I expect after surgery? Can I eat and drink after surgery? How much pain will I have? When can I go back to work? 5. What is your experience with this type of surgery? Is the surgery center accredited? 6. Do you take my insurance? How much will the surgery cost? Where to find more information:  Curator of Surgeons: https://farmer.org/  Centers for Disease Control and Prevention: https://russell-walls.com/ Summary  Your surgical team takes specific precautions to prevent surgical errors and complications.  You can take steps to promote a safe surgery.  Ask questions before your procedure, and make sure that you understand what will happen.  Make sure that you understand your instructions for home care. This information is not intended to replace advice given to you by your health care provider. Make sure you discuss any questions you have with your health care provider. Document Released: 11/05/2016 Document Revised: 11/05/2016 Document Reviewed:  11/05/2016 Elsevier Interactive Patient Education  [employer].

## 2017-08-03 NOTE — H&P (Signed)
GYNECOLOGY PREOPERATIVE HISTORY AND PHYSICAL   Subjective:  [name] is a 26 y.o. [id number] here for surgical management for permanent sterility.   Indications for procedure include: multiparity, desiring permanent sterilization. She will be approximately 6 weeks postpartum at time of procedure. No significant preoperative concerns.   Pertinent Gynecological History: Menses: patient has not yet resumed menses.  Is postpartum. Last pap: normal Date: 09/2015   Past Medical History:  Diagnosis Date  . Personal history of asthma    as a child   Past Surgical History:  Procedure Laterality Date  . broke leg     put to sleep to put back in place  . TONSILLECTOMY      OB History  Gravida Para Term Preterm AB Living  3 2 2   1 2   SAB TAB Ectopic Multiple Live Births  1     0 2    # Outcome Date GA Lbr Len/2nd Weight Sex Delivery Anes PTL Lv  3 Term 06/22/17 5813w1d 06:13 / 00:14 8 lb 6.8 oz (3.82 kg) M Vag-Spont EPI  LIV  2 Term 04/15/16 439w1d  7 lb 5.5 oz (3.331 kg) F Vag-Spont EPI  LIV  1 SAB 04/2015        FD      Family History  Problem Relation Age of Onset  . Diabetes Mother        borderline  . Diabetes Maternal Grandfather        aunt, uncle    Social History   Social History  . Marital status: Single    Spouse name: N/A  . Number of children: N/A  . Years of education: N/A   Occupational History  . unemployed    Social History Main Topics  . Smoking status: Former Smoker    Packs/day: 0.50    Years: 0.50    Types: Cigarettes    Quit date: 10/06/2014  . Smokeless tobacco: Never Used  . Alcohol use No  . Drug use: No  . Sexual activity: Not Currently    Partners: Male    Birth control/ protection: None     Comment: Tubal to be done later   Other Topics Concern  . Not on file   Social History Narrative  . No narrative on file    Current Outpatient Prescriptions on File Prior to Visit  Medication Sig Dispense Refill  . Multiple  Vitamin (MULTIVITAMIN) tablet Take 1 tablet by mouth daily.     No current facility-administered medications on file prior to visit.     Allergies  Allergen Reactions  . Stadol [Butorphanol]     Pt felt like she couldn't catch her breathe and felt like she was having a panic attack  . Sulfa Antibiotics Hives and Rash     Review of Systems Constitutional: No recent fever/chills/sweats Respiratory: No recent cough/bronchitis Cardiovascular: No chest pain Gastrointestinal: No recent nausea/vomiting/diarrhea Genitourinary: No UTI symptoms Hematologic/lymphatic:No history of coagulopathy or recent blood thinner use    Objective:   Blood pressure 111/80, pulse 88, height 5\' 6"  (1.676 m), weight 144 lb 3.2 oz (65.4 kg), currently breastfeeding. CONSTITUTIONAL: Well-developed, well-nourished female in no acute distress.  HENT:  Normocephalic, atraumatic, External right and left ear normal. Oropharynx is clear and moist EYES: Conjunctivae and EOM are normal. Pupils are equal, round, and reactive to light. No scleral icterus.  NECK: Normal range of motion, supple, no masses SKIN: Skin is warm and dry. No rash noted. Not diaphoretic.  No erythema. No pallor. NEUROLOGIC: Alert and oriented to person, place, and time. Normal reflexes, muscle tone coordination. No cranial nerve deficit noted. PSYCHIATRIC: Normal mood and affect. Normal behavior. Normal judgment and thought content. CARDIOVASCULAR: Normal heart rate noted, regular rhythm RESPIRATORY: Effort and breath sounds normal, no problems with respiration noted ABDOMEN: Soft, nontender, nondistended. PELVIC: Deferred MUSCULOSKELETAL: Normal range of motion. No edema and no tenderness. 2+ distal pulses.    Labs: Lab Results  Component Value Date   WBC 13.5 (H) 06/23/2017   HGB 9.6 (L) 06/23/2017   HCT 28.3 (L) 06/23/2017   MCV 84.9 06/23/2017   PLT 130 (L) 06/23/2017     Assessment:     Multiparity, desiring permanent  sterility      Plan:    Counseling: Procedure, risks, reasons, benefits and complications (including injury to bowel, bladder, major blood vessel, ureter, bleeding, possibility of transfusion, infection, or fistula formation) reviewed in detail. Likelihood of success in alleviating the patient's condition was discussed. Routine postoperative instructions will be reviewed with the patient and her family in detail after surgery.  Also reiterated risk of regret at current age, and failure rate of procedure.  The patient concurred with the proposed plan.  Preop testing ordered. Instructions reviewed, including NPO after midnight.   Hildred Laser, MD [facility]

## 2017-08-03 NOTE — Progress Notes (Signed)
    GYNECOLOGY PROGRESS NOTE  Subjective:    Patient ID: Joan Moreno, female    DOB: 03/27/1991, 26 y.o.   MRN: [id number]  HPI  Patient is a 26 y.o. [id number] female who presents for pre-op exam.  Scheduled for interval Laparoscopic BTL on 08/15/2017. Is currently doing well, no complaints. Is ~ 4 weeks postpartum.    The following portions of the patient's history were reviewed and updated as appropriate: allergies, current medications, past family history, past medical history, past social history, past surgical history and problem list.  Review of Systems Pertinent items noted in HPI and remainder of comprehensive ROS otherwise negative.   Objective:   Blood pressure 111/80, pulse 88, height 5\' 6"  (1.676 m), weight 144 lb 3.2 oz (65.4 kg), currently breastfeeding. General appearance: alert and no distress Abdomen: soft, non-tender; bowel sounds normal; no masses,  no organomegaly Pelvic: deferred Extremities: extremities normal, atraumatic, no cyanosis or edema Neurologic: Grossly normal   Assessment:   Pre-operative examination Desires permanent sterility  Plan:   - Patient desires permanent sterilization.  Other reversible forms of contraception were once again discussed with patient; she declines all other modalities. Risks of procedure discussed with patient including but not limited to: risk of regret, permanence of method, bleeding, infection, injury to surrounding organs and need for additional procedures.  Failure risk of 1-2 % with increased risk of ectopic gestation if pregnancy occurs was also discussed with patient.  Patient verbalized understanding of these risks and wants to proceed with sterilization.  - Patient desires surgical management with interval laparoscopic bilateral tubal ligation.  The risks of surgery were discussed in detail with the patient including but not limited to: bleeding which may require transfusion or reoperation; infection which may  require prolonged hospitalization or re-hospitalization and antibiotic therapy; injury to bowel, bladder, ureters and major vessels or other surrounding organs; need for additional procedures including laparotomy; thromboembolic phenomenon, incisional problems and other postoperative or anesthesia complications.  The postoperative expectations were also discussed in detail. The patient also understands the alternative treatment options which were discussed in full. All questions were answered.  She was told that she will be contacted by our surgical scheduler regarding the time and date of her surgery; routine preoperative instructions of having nothing to eat or drink after midnight on the day prior to surgery and also coming to the hospital 1.5 hours prior to her time of surgery were also emphasized.  She was told she may be called for a preoperative appointment about a week prior to surgery and will be given further preoperative instructions at that visit. Printed patient education handouts about the procedure were previously given to the patient to review at home.   Hildred Laserherry, Ronald Vinsant, MD [facility]

## 2017-08-09 ENCOUNTER — Encounter
Admission: RE | Admit: 2017-08-09 | Discharge: 2017-08-09 | Disposition: A | Discharge status: Home / Self Care | Payer: Medicaid Other | Source: Ambulatory Visit | Attending: Obstetrics and Gynecology | Admitting: Obstetrics and Gynecology

## 2017-08-09 NOTE — Patient Instructions (Signed)
Your procedure is scheduled on: Monday Oct. 29, 2018. Report to Same Day Surgery. To find out your arrival time please call [phone] between 1PM - 3PM on Friday Oct. 26, 2018.  Remember: Instructions that are not followed completely may result in serious medical risk, up to and including death, or upon the discretion of your surgeon and anesthesiologist your surgery may need to be rescheduled.     _X__ 1. Do not eat food after midnight the night before your procedure.                 No gum chewing or hard candies. You may drink clear liquids up to 2 hours                 before you are scheduled to arrive for your surgery- DO not drink clear                 liquids within 2 hours of the start of your surgery.                 Clear Liquids include:  water, apple juice without pulp, clear carbohydrate                 drink such as Clearfast of Gartorade, Black Coffee or Tea (Do not add                 anything to coffee or tea).     ____ 2.  No Alcohol for 24 hours before or after surgery.   ____ 3.  Do Not Smoke or use e-cigarettes For 24 Hours Prior to Your Surgery.                 Do not use any chewable tobacco products for at least 6 hours prior to                 surgery.  ____  4.  Bring all medications with you on the day of surgery if instructed.   __x__  5.  Notify your doctor if there is any change in your medical condition      (cold, fever, infections).     Do not wear jewelry, make-up, hairpins, clips or nail polish. Do not wear lotions, powders, or perfumes. You may wear deodorant. Do not shave 48 hours prior to surgery. Men may shave face and neck. Do not bring valuables to the hospital.    [facility] belongings or valuables.  Contacts, dentures or bridgework may not be worn into surgery. Leave your suitcase in the car. After surgery it may be brought to your room. For patients admitted to the hospital,  discharge time is determined by your treatment team.   Patients discharged the day of surgery will not be allowed to drive home.   Please read over the following fact sheets that you were given:   Preparing for Surgery   ____ Take these medicines the morning of surgery with A SIP OF WATER: NONE     ____ Fleet Enema (as directed)   __x__ Use CHG Soap as directed  ____ Use inhalers on the day of surgery  ____ Stop metformin 2 days prior to surgery    ____ Take 1/2 of usual insulin dose the night before surgery. No insulin the morning          of surgery.   ____ Stop Coumadin/Plavix/aspirin on does not apply.  _x__ Stop Anti-inflammatories on Advil,  Aleve, Motrin, Ibuprofen, Naproxen, Naprosyn, Goodies Powders or Aspirin  Products. It is OK to take Tylenol for pain.   ____ Stop supplements until after surgery.    ____ Bring C-Pap to the hospital.

## 2017-08-09 NOTE — Pre-Procedure Instructions (Signed)
Pt plans to come to PAT tomorrow around 1300 for blood draw.

## 2017-08-10 ENCOUNTER — Encounter
Admission: RE | Admit: 2017-08-10 | Discharge: 2017-08-10 | Disposition: A | Discharge status: Home / Self Care | Payer: Medicaid Other | Source: Ambulatory Visit | Attending: Obstetrics and Gynecology | Admitting: Obstetrics and Gynecology

## 2017-08-10 DIAGNOSIS — Z641 Problems related to multiparity: Secondary | ICD-10-CM | POA: Diagnosis present

## 2017-08-10 LAB — CBC
HCT: 39.7 % (ref 35.0–47.0)
HEMOGLOBIN: 12.9 g/dL (ref 12.0–16.0)
MCH: 27.5 pg (ref 26.0–34.0)
MCHC: 32.6 g/dL (ref 32.0–36.0)
MCV: 84.4 fL (ref 80.0–100.0)
PLATELETS: 225 10*3/uL (ref 150–440)
RBC: 4.7 MIL/uL (ref 3.80–5.20)
RDW: 13.7 % (ref 11.5–14.5)
WBC: 6.1 10*3/uL (ref 3.6–11.0)

## 2017-08-15 ENCOUNTER — Ambulatory Visit: Payer: Medicaid Other | Admitting: Anesthesiology

## 2017-08-15 ENCOUNTER — Encounter: Payer: Medicaid Other | Admitting: Obstetrics and Gynecology

## 2017-08-15 ENCOUNTER — Encounter: Payer: Self-pay | Admitting: *Deleted

## 2017-08-15 ENCOUNTER — Ambulatory Visit
Admission: RE | Admit: 2017-08-15 | Discharge: 2017-08-15 | Disposition: A | Discharge status: Home / Self Care | Payer: Medicaid Other | Source: Ambulatory Visit | Attending: Obstetrics and Gynecology | Admitting: Obstetrics and Gynecology

## 2017-08-15 ENCOUNTER — Encounter
Admission: RE | Disposition: A | Discharge status: Home / Self Care | Payer: Self-pay | Source: Ambulatory Visit | Attending: Obstetrics and Gynecology

## 2017-08-15 DIAGNOSIS — R569 Unspecified convulsions: Secondary | ICD-10-CM | POA: Diagnosis not present

## 2017-08-15 DIAGNOSIS — Z888 Allergy status to other drugs, medicaments and biological substances status: Secondary | ICD-10-CM | POA: Diagnosis not present

## 2017-08-15 DIAGNOSIS — Z302 Encounter for sterilization: Secondary | ICD-10-CM

## 2017-08-15 DIAGNOSIS — Z87891 Personal history of nicotine dependence: Secondary | ICD-10-CM | POA: Diagnosis not present

## 2017-08-15 DIAGNOSIS — Z882 Allergy status to sulfonamides status: Secondary | ICD-10-CM | POA: Diagnosis not present

## 2017-08-15 HISTORY — PX: LAPAROSCOPIC TUBAL LIGATION: SHX1937

## 2017-08-15 LAB — POCT PREGNANCY, URINE: Preg Test, Ur: NEGATIVE

## 2017-08-15 SURGERY — LIGATION, FALLOPIAN TUBE, LAPAROSCOPIC
Anesthesia: General | Site: Uterus | Laterality: Bilateral | Wound class: Clean

## 2017-08-15 MED ORDER — OXYCODONE HCL 5 MG PO TABS
5.0000 mg | ORAL_TABLET | Freq: Once | ORAL | Status: AC | PRN
  Administered 2017-08-15: 5 mg via ORAL

## 2017-08-15 MED ORDER — LIDOCAINE HCL (PF) 2 % IJ SOLN
INTRAMUSCULAR | Status: AC
  Filled 2017-08-15: qty 10

## 2017-08-15 MED ORDER — ESTROGENS, CONJUGATED 0.625 MG/GM VA CREA
TOPICAL_CREAM | VAGINAL | Status: AC
  Filled 2017-08-15: qty 30

## 2017-08-15 MED ORDER — OXYCODONE HCL 5 MG PO TABS
ORAL_TABLET | ORAL | Status: AC
  Filled 2017-08-15: qty 1

## 2017-08-15 MED ORDER — ACETAMINOPHEN NICU IV SYRINGE 10 MG/ML
INTRAVENOUS | Status: AC
  Filled 2017-08-15: qty 1

## 2017-08-15 MED ORDER — ACETAMINOPHEN 10 MG/ML IV SOLN
INTRAVENOUS | Status: DC | PRN
  Administered 2017-08-15: 1000 mg via INTRAVENOUS

## 2017-08-15 MED ORDER — LIDOCAINE HCL (CARDIAC) 20 MG/ML IV SOLN
INTRAVENOUS | Status: DC | PRN
  Administered 2017-08-15: 80 mg via INTRAVENOUS

## 2017-08-15 MED ORDER — IBUPROFEN 800 MG PO TABS: 800.0000 mg | ORAL_TABLET | Freq: Three times a day (TID) | ORAL | 0 refills | Status: DC | PRN

## 2017-08-15 MED ORDER — PHENYLEPHRINE HCL 10 MG/ML IJ SOLN
INTRAMUSCULAR | Status: AC
  Filled 2017-08-15: qty 1

## 2017-08-15 MED ORDER — ROCURONIUM BROMIDE 100 MG/10ML IV SOLN
INTRAVENOUS | Status: DC | PRN
  Administered 2017-08-15: 40 mg via INTRAVENOUS

## 2017-08-15 MED ORDER — FENTANYL CITRATE (PF) 100 MCG/2ML IJ SOLN
INTRAMUSCULAR | Status: DC | PRN
  Administered 2017-08-15 (×2): 50 ug via INTRAVENOUS

## 2017-08-15 MED ORDER — MIDAZOLAM HCL 2 MG/2ML IJ SOLN
INTRAMUSCULAR | Status: DC | PRN
  Administered 2017-08-15: 2 mg via INTRAVENOUS

## 2017-08-15 MED ORDER — DEXAMETHASONE SODIUM PHOSPHATE 10 MG/ML IJ SOLN
INTRAMUSCULAR | Status: AC
  Filled 2017-08-15: qty 1

## 2017-08-15 MED ORDER — FENTANYL CITRATE (PF) 100 MCG/2ML IJ SOLN
25.0000 ug | INTRAMUSCULAR | Status: DC | PRN
  Administered 2017-08-15 (×4): 25 ug via INTRAVENOUS

## 2017-08-15 MED ORDER — FENTANYL CITRATE (PF) 100 MCG/2ML IJ SOLN
INTRAMUSCULAR | Status: AC
  Administered 2017-08-15: 25 ug via INTRAVENOUS
  Filled 2017-08-15: qty 2

## 2017-08-15 MED ORDER — OXYCODONE HCL 5 MG PO TABS: 5.0000 mg | ORAL_TABLET | ORAL | 0 refills | Status: DC | PRN

## 2017-08-15 MED ORDER — BUPIVACAINE HCL (PF) 0.5 % IJ SOLN
INTRAMUSCULAR | Status: DC | PRN
  Administered 2017-08-15: 10 mL

## 2017-08-15 MED ORDER — ONDANSETRON HCL 4 MG/2ML IJ SOLN
INTRAMUSCULAR | Status: DC | PRN
  Administered 2017-08-15: 4 mg via INTRAVENOUS

## 2017-08-15 MED ORDER — SUGAMMADEX SODIUM 200 MG/2ML IV SOLN
INTRAVENOUS | Status: AC
  Filled 2017-08-15: qty 2

## 2017-08-15 MED ORDER — FAMOTIDINE 20 MG PO TABS
20.0000 mg | ORAL_TABLET | Freq: Once | ORAL | Status: AC
  Administered 2017-08-15: 20 mg via ORAL

## 2017-08-15 MED ORDER — LACTATED RINGERS IV SOLN
INTRAVENOUS | Status: DC
  Administered 2017-08-15: 07:00:00 via INTRAVENOUS

## 2017-08-15 MED ORDER — PROPOFOL 10 MG/ML IV BOLUS
INTRAVENOUS | Status: DC | PRN
Start: 2017-08-15 — End: 2017-08-15
  Administered 2017-08-15: 150 mg via INTRAVENOUS

## 2017-08-15 MED ORDER — SUGAMMADEX SODIUM 200 MG/2ML IV SOLN
INTRAVENOUS | Status: DC | PRN
  Administered 2017-08-15: 300 mg via INTRAVENOUS

## 2017-08-15 MED ORDER — MIDAZOLAM HCL 2 MG/2ML IJ SOLN
INTRAMUSCULAR | Status: AC
  Filled 2017-08-15: qty 2

## 2017-08-15 MED ORDER — DOCUSATE SODIUM 100 MG PO CAPS: 100.0000 mg | ORAL_CAPSULE | Freq: Two times a day (BID) | ORAL | 1 refills | Status: DC | PRN

## 2017-08-15 MED ORDER — EPHEDRINE SULFATE 50 MG/ML IJ SOLN
INTRAMUSCULAR | Status: AC
  Filled 2017-08-15: qty 1

## 2017-08-15 MED ORDER — FENTANYL CITRATE (PF) 100 MCG/2ML IJ SOLN
INTRAMUSCULAR | Status: AC
  Filled 2017-08-15: qty 2

## 2017-08-15 MED ORDER — ONDANSETRON HCL 4 MG/2ML IJ SOLN
INTRAMUSCULAR | Status: AC
  Filled 2017-08-15: qty 2

## 2017-08-15 MED ORDER — FAMOTIDINE 20 MG PO TABS
ORAL_TABLET | ORAL | Status: AC
  Administered 2017-08-15: 20 mg via ORAL
  Filled 2017-08-15: qty 1

## 2017-08-15 MED ORDER — PROPOFOL 10 MG/ML IV BOLUS
INTRAVENOUS | Status: AC
  Filled 2017-08-15: qty 40

## 2017-08-15 MED ORDER — OXYCODONE HCL 5 MG/5ML PO SOLN: 5.0000 mg | Freq: Once | ORAL | Status: AC | PRN

## 2017-08-15 MED ORDER — DEXAMETHASONE SODIUM PHOSPHATE 10 MG/ML IJ SOLN
INTRAMUSCULAR | Status: DC | PRN
  Administered 2017-08-15: 10 mg via INTRAVENOUS

## 2017-08-15 MED ORDER — ROCURONIUM BROMIDE 50 MG/5ML IV SOLN
INTRAVENOUS | Status: AC
  Filled 2017-08-15: qty 1

## 2017-08-15 MED ORDER — GLYCOPYRROLATE 0.2 MG/ML IJ SOLN
INTRAMUSCULAR | Status: AC
  Filled 2017-08-15: qty 1

## 2017-08-15 MED ORDER — BUPIVACAINE HCL (PF) 0.5 % IJ SOLN
INTRAMUSCULAR | Status: AC
Start: 2017-08-15 — End: ?
  Filled 2017-08-15: qty 30

## 2017-08-15 MED ORDER — SUCCINYLCHOLINE CHLORIDE 20 MG/ML IJ SOLN
INTRAMUSCULAR | Status: AC
  Filled 2017-08-15: qty 1

## 2017-08-15 SURGICAL SUPPLY — 23 items
BLADE SURG SZ11 CARB STEEL (BLADE) ×3 IMPLANT
CATH ROBINSON RED A/P 16FR (CATHETERS) ×3 IMPLANT
CHLORAPREP W/TINT 26ML (MISCELLANEOUS) ×3 IMPLANT
DERMABOND ADVANCED (GAUZE/BANDAGES/DRESSINGS) ×2
DERMABOND ADVANCED .7 DNX12 (GAUZE/BANDAGES/DRESSINGS) ×1 IMPLANT
GLOVE BIO SURGEON STRL SZ 6.5 (GLOVE) ×2 IMPLANT
GLOVE BIO SURGEONS STRL SZ 6.5 (GLOVE) ×1
GLOVE INDICATOR 7.0 STRL GRN (GLOVE) ×3 IMPLANT
GOWN STRL REUS W/ TWL LRG LVL3 (GOWN DISPOSABLE) ×2 IMPLANT
GOWN STRL REUS W/TWL LRG LVL3 (GOWN DISPOSABLE) ×4
KIT RM TURNOVER CYSTO AR (KITS) ×3 IMPLANT
LABEL OR SOLS (LABEL) ×3 IMPLANT
NS IRRIG 500ML POUR BTL (IV SOLUTION) ×3 IMPLANT
PACK GYN LAPAROSCOPIC (MISCELLANEOUS) ×3 IMPLANT
PAD PREP 24X41 OB/GYN DISP (PERSONAL CARE ITEMS) ×3 IMPLANT
SHEARS ENDO 5MM LNG  ASK BEFOR (MISCELLANEOUS) ×2
SHEARS ENDO 5MM LNG ASK BEFOR (MISCELLANEOUS) ×1 IMPLANT
SUT VIC AB 0 CT2 27 (SUTURE) ×3 IMPLANT
SUT VIC AB 4-0 SH 27 (SUTURE) ×2
SUT VIC AB 4-0 SH 27XANBCTRL (SUTURE) ×1 IMPLANT
TROCAR ENDO BLADELESS 11MM (ENDOMECHANICALS) ×3 IMPLANT
TROCAR XCEL UNIV SLVE 11M 100M (ENDOMECHANICALS) ×3 IMPLANT
TUBING INSUFFLATOR HI FLOW (MISCELLANEOUS) ×3 IMPLANT

## 2017-08-15 NOTE — Anesthesia Postprocedure Evaluation (Signed)
Anesthesia Post Note  Patient: Joan Moreno  Procedure(s) Performed: LAPAROSCOPIC BILATERAL TUBAL LIGATION (Bilateral Uterus)  Patient location during evaluation: PACU Anesthesia Type: General Level of consciousness: awake and alert Pain management: pain level controlled Vital Signs Assessment: post-procedure vital signs reviewed and stable Respiratory status: spontaneous breathing, nonlabored ventilation, respiratory function stable and patient connected to nasal cannula oxygen Cardiovascular status: blood pressure returned to baseline and stable Postop Assessment: no apparent nausea or vomiting Anesthetic complications: no     Last Vitals:  Vitals:   08/15/17 0916 08/15/17 0945  BP: (!) 147/86 (!) 147/80  Pulse: 68 64  Resp: 16   Temp: (!) 35.8 C   SpO2: 100% 100%    Last Pain:  Vitals:   08/15/17 0945  TempSrc:   PainSc: 4                  Cleda MccreedyJoseph K Jaqua Ching

## 2017-08-15 NOTE — Transfer of Care (Signed)
Immediate Anesthesia Transfer of Care Note  Patient: Joan Moreno  Procedure(s) Performed: Procedure(s): LAPAROSCOPIC BILATERAL TUBAL LIGATION (Bilateral)  Patient Location: PACU  Anesthesia Type:General  Level of Consciousness: sedated  Airway & Oxygen Therapy: Patient Spontanous Breathing and Patient connected to face mask oxygen  Post-op Assessment: Report given to RN and Post -op Vital signs reviewed and stable  Post vital signs: Reviewed and stable  Last Vitals:  Vitals:   08/15/17 0620 08/15/17 0833  BP: 127/80 119/75  Pulse: 69 (!) 116  Resp: 18 14  Temp: 36.9 C (!) 36.2 C  SpO2: 97% 100%    Complications: No apparent anesthesia complications

## 2017-08-15 NOTE — Anesthesia Procedure Notes (Signed)
Procedure Name: Intubation Date/Time: 08/15/2017 7:24 AM Performed by: Doreen Salvage Pre-anesthesia Checklist: Patient identified, Patient being monitored, Timeout performed, Emergency Drugs available and Suction available Patient Re-evaluated:Patient Re-evaluated prior to induction Oxygen Delivery Method: Circle system utilized Preoxygenation: Pre-oxygenation with 100% oxygen Induction Type: IV induction Ventilation: Mask ventilation without difficulty Laryngoscope Size: Mac and 3 Grade View: Grade I Tube type: Oral Tube size: 7.0 mm Number of attempts: 1 Airway Equipment and Method: Stylet Placement Confirmation: ETT inserted through vocal cords under direct vision,  positive ETCO2 and breath sounds checked- equal and bilateral Secured at: 21 cm Tube secured with: Tape Dental Injury: Teeth and Oropharynx as per pre-operative assessment

## 2017-08-15 NOTE — H&P (Signed)
UPDATE TO PREVIOUS HISTORY AND PHYSICAL  The patient has been seen and examined.  H&P is up to date, no changes noted.  Joan PontoSavanna A Eisenstein is a 26 y.o. [id number] here for surgical management for permanent sterility.  All questions answered. Patient can proceed to the OR for scheduled procedure.   Hildred Laserherry, Dempsey Knotek, MD 08/15/2017 7:23 AM

## 2017-08-15 NOTE — Anesthesia Post-op Follow-up Note (Signed)
Anesthesia QCDR form completed.        

## 2017-08-15 NOTE — Discharge Instructions (Signed)
Laparoscopic Tubal Ligation, Care After °Refer to this sheet in the next few weeks. These instructions provide you with information about caring for yourself after your procedure. Your health care provider may also give you more specific instructions. Your treatment has been planned according to current medical practices, but problems sometimes occur. Call your health care provider if you have any problems or questions after your procedure. °What can I expect after the procedure? °After the procedure, it is common to have: °· A sore throat. °· Discomfort in your shoulder. °· Mild discomfort or cramping in your abdomen. °· Gas pains. °· Pain or soreness in the area where the surgical cut (incision) was made. °· A bloated feeling. °· Tiredness. °· Nausea. °· Vomiting. ° °Follow these instructions at home: °Medicines °· Take over-the-counter and prescription medicines only as told by your health care provider. °· Do not take aspirin because it can cause bleeding. °· Do not drive or operate heavy machinery while taking prescription pain medicine. °Activity °· Rest for the rest of the day. °· Return to your normal activities as told by your health care provider. Ask your health care provider what activities are safe for you. °Incision care ° °· Follow instructions from your health care provider about how to take care of your incision. Make sure you: °? Wash your hands with soap and water before you change your bandage (dressing). If soap and water are not available, use hand sanitizer. °? Change your dressing as told by your health care provider. °? Leave stitches (sutures) in place. They may need to stay in place for 2 weeks or longer. °· Check your incision area every day for signs of infection. Check for: °? More redness, swelling, or pain. °? More fluid or blood. °? Warmth. °? Pus or a bad smell. °Other Instructions °· Do not take baths, swim, or use a hot tub until your health care provider approves. You may take  showers. °· Keep all follow-up visits as told by your health care provider. This is important. °· Have someone help you with your daily household tasks for the first few days. °Contact a health care provider if: °· You have more redness, swelling, or pain around your incision. °· Your incision feels warm to the touch. °· You have pus or a bad smell coming from your incision. °· The edges of your incision break open after the sutures have been removed. °· Your pain does not improve after 2-3 days. °· You have a rash. °· You repeatedly become dizzy or light-headed. °· Your pain medicine is not helping. °· You are constipated. °Get help right away if: °· You have a fever. °· You faint. °· You have increasing pain in your abdomen. °· You have severe pain in one or both of your shoulders. °· You have fluid or blood coming from your sutures or from your vagina. °· You have shortness of breath or difficulty breathing. °· You have chest pain or leg pain. °· You have ongoing nausea, vomiting, or diarrhea. °This information is not intended to replace advice given to you by your health care provider. Make sure you discuss any questions you have with your health care provider. °Document Released: 04/23/2005 Document Revised: 03/08/2016 Document Reviewed: 09/14/2015 °Elsevier Interactive Patient Education © 2018 [employer] ° °

## 2017-08-15 NOTE — Anesthesia Preprocedure Evaluation (Signed)
Anesthesia Evaluation  Patient identified by MRN, date of birth, ID band Patient awake    Reviewed: Allergy & Precautions, H&P , NPO status , Patient's Chart, lab work & pertinent test results  History of Anesthesia Complications Negative for: history of anesthetic complications  Airway Mallampati: I  TM Distance: >3 FB Neck ROM: full    Dental  (+) Chipped   Pulmonary neg shortness of breath, former smoker,           Cardiovascular Exercise Tolerance: Good (-) angina(-) Past MI negative cardio ROS       Neuro/Psych Seizures - (as a child), Well Controlled,  negative psych ROS   GI/Hepatic negative GI ROS, Neg liver ROS, neg GERD  ,  Endo/Other  negative endocrine ROS  Renal/GU      Musculoskeletal   Abdominal   Peds  Hematology negative hematology ROS (+)   Anesthesia Other Findings Past Medical History: No date: Medical history non-contributory No date: Personal history of asthma     Comment:  as a child  Past Surgical History: No date: broke leg     Comment:  put to sleep to put back in place No date: TONSILLECTOMY     Reproductive/Obstetrics negative OB ROS                             Anesthesia Physical Anesthesia Plan  ASA: III  Anesthesia Plan: General ETT   Post-op Pain Management:    Induction: Intravenous  PONV Risk Score and Plan: 4 or greater and Ondansetron, Dexamethasone, Midazolam and Treatment may vary due to age or medical condition  Airway Management Planned: Oral ETT  Additional Equipment:   Intra-op Plan:   Post-operative Plan: Extubation in OR  Informed Consent: I have reviewed the patients History and Physical, chart, labs and discussed the procedure including the risks, benefits and alternatives for the proposed anesthesia with the patient or authorized representative who has indicated his/her understanding and acceptance.   Dental Advisory  Given  Plan Discussed with: Anesthesiologist, CRNA and Surgeon  Anesthesia Plan Comments: (Patient consented for risks of anesthesia including but not limited to:  - adverse reactions to medications - damage to teeth, lips or other oral mucosa - sore throat or hoarseness - Damage to heart, brain, lungs or loss of life  Patient voiced understanding.)        Anesthesia Quick Evaluation

## 2017-08-15 NOTE — Op Note (Signed)
Procedure(s): LAPAROSCOPIC BILATERAL TUBAL LIGATION Procedure Note  Joan Moreno female 26 y.o. 08/15/2017  Indications: The patient is a 26 y.o. [id number] female who desires permanent sterility  Pre-operative Diagnosis: Multiparous, desires permanent sterility  Post-operative Diagnosis: Same  Surgeon: Hildred LaserAnika Elowen Debruyn, MD  Assistants: None  Anesthesia: General endotracheal anesthesia   Procedure Details: The patient was seen in the Holding Room. The risks, benefits, complications, treatment options, and expected outcomes were discussed with the patient.  The patient concurred with the proposed plan, giving informed consent.  The site of surgery properly noted/marked. The patient was taken to the Operating Room, identified as Joan Moreno and the procedure verified as Procedure(s) (LRB): LAPAROSCOPIC BILATERAL TUBAL LIGATION (Bilateral). A Time Out was held and the above information confirmed.  She was then placed under general anesthesia without difficulty. She was placed in the dorsal lithotomy position, and was prepped and draped in a sterile manner.  A straight catheterization was performed. A sterile speculum was inserted into the vagina and the cervix was grasped at the anterior lip using a single-toothed tenaculum.  The uterus was sounded to 7 cm, and a Hulka clamp was placed for uterine manipulation.  The speculum and tenaculum were then removed.    Attention was then turned to the patient's abdomen where an 11-mm skin incision was made in the umbilical fold.  The Optiview 11-mm trocar and sleeve were then advanced without difficulty with the laparoscope under direct visualization into the abdomen.  The abdomen was then insufflated with carbon dioxide gas and adequate pneumoperitoneum was obtained.  A survey of the patient's pelvis and abdomen revealed entirely normal anatomy.  The fallopian tubes were observed and found to be normal in appearance.  A second 11-mm skin incision  was made in the right lateral segment of the abdomen. The Filshie clip applicator was placed through the lateral operative port, and a Filshie clip was placed on the right fallopian tube ,about 3 cm from the cornual attachment, with care given to incorporate the underlying mesosalpinx.  A similar process was carried out on the contralateral side allowing for bilateral tubal sterilization.   Good hemostasis was noted overall.  The instruments were then removed from the patient's abdomen and the skin incision was repaired with 4-0 Vicryl, and the skin was covered with Dermabond.  The uterine manipulator and the tenaculum were removed from the vagina without complications. The patient tolerated the procedure well.  Sponge, lap, and needle counts were correct times two.  The patient was then taken to the recovery room awake, extubated and in stable condition.  The patient will be discharged to home as per PACU criteria.  Routine postoperative instructions given.  She was prescribed Percocet, Ibuprofen and Colace.  She will follow up in the clinic in 1-2 weeks for postoperative evaluation.   Estimated Blood Loss:  minimal      Drains: straight catheterization prior to procedure with  300 ml of clear urine         Total IV Fluids:  700 ml  Specimens: None         Implants: None         Complications:  None; patient tolerated the procedure well.         Disposition: PACU - hemodynamically stable.         Condition: stable   Hildred LaserAnika Ryin Schillo, MD [facility]

## 2017-08-23 ENCOUNTER — Ambulatory Visit (INDEPENDENT_AMBULATORY_CARE_PROVIDER_SITE_OTHER): Payer: Medicaid Other | Admitting: Obstetrics and Gynecology

## 2017-08-23 ENCOUNTER — Encounter: Payer: Self-pay | Admitting: Obstetrics and Gynecology

## 2017-08-23 VITALS — BP 100/67 | HR 76 | Ht 66.0 in | Wt 145.8 lb

## 2017-08-23 DIAGNOSIS — Z09 Encounter for follow-up examination after completed treatment for conditions other than malignant neoplasm: Secondary | ICD-10-CM

## 2017-08-23 DIAGNOSIS — Z9851 Tubal ligation status: Secondary | ICD-10-CM

## 2017-08-23 NOTE — Progress Notes (Signed)
    OBSTETRICS/GYNECOLOGY POST-OPERATIVE CLINIC VISIT  Subjective:     Joan Moreno is a 26 y.o. [phone] female who presents to the clinic 1 weeks status post laparoscopic tubal ligation for desired permanent sterility. Eating a regular diet without difficulty. Bowel movements are normal. Pain is controlled with current analgesics. Medications being used: prescription NSAID's including ibuprofen (Motrin) and narcotic analgesics including oxycodone/acetaminophen (Percocet, Tylox).  The following portions of the patient's history were reviewed and updated as appropriate: allergies, current medications, past family history, past medical history, past social history, past surgical history and problem list.  Review of Systems Pertinent items noted in HPI and remainder of comprehensive ROS otherwise negative.    Objective:    BP 100/67 (BP Location: Left Arm, Patient Position: Sitting, Cuff Size: Normal)   Pulse 76   Ht 5\' 6"  (1.676 m)   Wt 145 lb 12.8 oz (66.1 kg)   BMI 23.53 kg/m  General:  alert and no distress  Abdomen: soft, bowel sounds active, non-tender  Incision:   healing well, no drainage, no erythema, no hernia, no seroma, no swelling, no dehiscence, incision well approximated    Pathology:  None  Assessment:    Doing well postoperatively.  S/p tubal ligation  Plan:   1. Continue any current medications. 2. Wound care discussed. 3. Operative findings again reviewed. Pathology report discussed. 4. Activity restrictions: no bending, stooping, or squatting, no lifting more than 10 pounds and pelvic rest x 1 week 5. Anticipated return to work: not applicable. 6. Follow up: this week for postpartum visit    Hildred Laserherry, Joan Oldfield, MD [facility]

## 2017-08-26 ENCOUNTER — Encounter: Payer: Medicaid Other | Admitting: Certified Nurse Midwife

## 2017-11-22 ENCOUNTER — Other Ambulatory Visit: Payer: Self-pay

## 2017-11-22 ENCOUNTER — Emergency Department: Payer: Medicaid Other

## 2017-11-22 ENCOUNTER — Emergency Department
Admission: EM | Admit: 2017-11-22 | Discharge: 2017-11-22 | Disposition: A | Discharge status: Home / Self Care | Payer: Medicaid Other | Attending: Emergency Medicine | Admitting: Emergency Medicine

## 2017-11-22 DIAGNOSIS — Y929 Unspecified place or not applicable: Secondary | ICD-10-CM | POA: Insufficient documentation

## 2017-11-22 DIAGNOSIS — W0110XA Fall on same level from slipping, tripping and stumbling with subsequent striking against unspecified object, initial encounter: Secondary | ICD-10-CM | POA: Insufficient documentation

## 2017-11-22 DIAGNOSIS — S8012XA Contusion of left lower leg, initial encounter: Secondary | ICD-10-CM

## 2017-11-22 DIAGNOSIS — Y990 Civilian activity done for income or pay: Secondary | ICD-10-CM | POA: Diagnosis not present

## 2017-11-22 DIAGNOSIS — Y9301 Activity, walking, marching and hiking: Secondary | ICD-10-CM | POA: Insufficient documentation

## 2017-11-22 DIAGNOSIS — Z87891 Personal history of nicotine dependence: Secondary | ICD-10-CM | POA: Diagnosis not present

## 2017-11-22 DIAGNOSIS — S8992XA Unspecified injury of left lower leg, initial encounter: Secondary | ICD-10-CM | POA: Diagnosis present

## 2017-11-22 MED ORDER — NAPROXEN 500 MG PO TABS: 500.0000 mg | ORAL_TABLET | Freq: Two times a day (BID) | ORAL | 0 refills | Status: DC

## 2017-11-22 NOTE — ED Triage Notes (Signed)
Pt states she slipped on the wet floor at work on Friday morning and injury her LLE. Pt ambulatory to triage without any difficulty. [facility]

## 2017-11-22 NOTE — ED Provider Notes (Signed)
[facility] Emergency Department Provider Note   ____________________________________________   First MD Initiated Contact with Patient 11/22/17 1210     (approximate)  I have reviewed the triage vital signs and the nursing notes.   HISTORY  Chief Complaint Leg Injury    HPI Joan Moreno is a 27 y.o. female patient complaint left lower leg pain secondary to slipping on wet floor at work 4 days ago.  Patient states she cannot bear weight with difficulty.  Patient  continue to have pain to the left lower leg and concern for ecchymotic changes migrated down to her ankle.  Patient rates the pain as a 6/10.  Patient described the pain is "aching".  Patient has continued to use over-the-counter NSAIDs.   Past Medical History:  Diagnosis Date  . Medical history non-contributory   . Personal history of asthma    as a child    There are no active problems to display for this patient.   Past Surgical History:  Procedure Laterality Date  . broke leg     put to sleep to put back in place  . LAPAROSCOPIC TUBAL LIGATION Bilateral 08/15/2017   Procedure: LAPAROSCOPIC BILATERAL TUBAL LIGATION;  Surgeon: Hildred Laserherry, Anika, MD;  Location: [facility] ORS;  Service: Gynecology;  Laterality: Bilateral;  . TONSILLECTOMY      Prior to Admission medications   Medication Sig Start Date End Date Taking? Authorizing Provider  acetaminophen (TYLENOL) 325 MG tablet Take 325-650 mg by mouth every 6 (six) hours as needed for moderate pain or headache.    [provider]  docusate sodium (COLACE) 100 MG capsule Take 1 capsule (100 mg total) by mouth 2 (two) times daily as needed for mild constipation. Patient not taking: Reported on 08/23/2017 08/15/17   Hildred Laserherry, Anika, MD  ibuprofen (ADVIL,MOTRIN) 800 MG tablet Take 1 tablet (800 mg total) by mouth every 8 (eight) hours as needed for mild pain. 08/15/17   Hildred Laserherry, Anika, MD  Multiple Vitamin (MULTIVITAMIN) tablet Take 1 tablet  by mouth daily.    [provider]  naproxen (NAPROSYN) 500 MG tablet Take 1 tablet (500 mg total) by mouth 2 (two) times daily with a meal. 11/22/17   Joni ReiningSmith, Ronald K, PA-C  oxyCODONE (OXY IR/ROXICODONE) 5 MG immediate release tablet Take 1 tablet (5 mg total) by mouth every 4 (four) hours as needed (for pain score of 1-4). 08/15/17   Hildred Laserherry, Anika, MD    Allergies Stadol [butorphanol] and Sulfa antibiotics  Family History  Problem Relation Age of Onset  . Diabetes Mother        borderline  . Diabetes Maternal Grandfather        aunt, uncle    Social History Social History   Tobacco Use  . Smoking status: Former Smoker    Packs/day: 0.50    Years: 0.50    Pack years: 0.25    Types: Cigarettes    Last attempt to quit: 10/06/2014    Years since quitting: 3.1  . Smokeless tobacco: Never Used  Substance Use Topics  . Alcohol use: No    Alcohol/week: 0.0 oz  . Drug use: No    Review of Systems Constitutional: No fever/chills Eyes: No visual changes. ENT: No sore throat. Cardiovascular: Denies chest pain. Respiratory: Denies shortness of breath. Gastrointestinal: No abdominal pain.  No nausea, no vomiting.  No diarrhea.  No constipation. Genitourinary: Negative for dysuria. Musculoskeletal: Lower leg pain Skin: Negative for rash.  Bruising to left lower  leg Neurological: Negative for headaches, focal weakness or numbness. Allergic/Immunilogical: Stadol and sulfa antibiotics ____________________________________________   PHYSICAL EXAM:  VITAL SIGNS: ED Triage Vitals  Enc Vitals Group     BP 11/22/17 1141 136/77     Pulse Rate 11/22/17 1141 79     Resp 11/22/17 1141 16     Temp 11/22/17 1141 98.4 F (36.9 C)     Temp Source 11/22/17 1141 Oral     SpO2 11/22/17 1141 99 %     Weight 11/22/17 1142 140 lb (63.5 kg)     Height 11/22/17 1142 5\' 6"  (1.676 m)     Head Circumference --      Peak Flow --      Pain Score 11/22/17 1141 6     Pain Loc --       Pain Edu? --      Excl. in GC? --    Constitutional: Alert and oriented. Well appearing and in no acute distress. Cardiovascular: Normal rate, regular rhythm. Grossly normal heart sounds.  Good peripheral circulation. Respiratory: Normal respiratory effort.  No retractions. Lungs CTAB. Musculoskeletal: Moderate guarding palpation of distal third of the anterior lower leg. Neurologic:  Normal speech and language. No gross focal neurologic deficits are appreciated. No gait instability. Skin:  Skin is warm, dry and intact. No rash noted.  Ecchymosis Psychiatric: Mood and affect are normal. Speech and behavior are normal.  ____________________________________________   LABS (all labs ordered are listed, but only abnormal results are displayed)  Labs Reviewed - No data to display ____________________________________________  EKG   ____________________________________________  RADIOLOGY  ED MD interpretation: No acute findings x-ray of the left tib-fib Official radiology report(s): Dg Tibia/fibula Left  Result Date: 11/22/2017 CLINICAL DATA:  Pain distal tib-fib.  Fall. EXAM: LEFT TIBIA AND FIBULA - 2 VIEW COMPARISON:  02/04/2007 FINDINGS: Chronic fracture deformity of the proximal tibia. Negative for acute fracture.  No arthropathy. IMPRESSION: No acute abnormality.  Chronic fracture deformity proximal tibia. Electronically Signed   By: Marlan Palau M.D.   On: 11/22/2017 12:50    ____________________________________________   PROCEDURES  Procedure(s) performed: None  Procedures  Critical Care performed: No  ____________________________________________   INITIAL IMPRESSION / ASSESSMENT AND PLAN / ED COURSE  As part of my medical decision making, I reviewed the following data within the electronic MEDICAL RECORD NUMBER    Left leg pain secondary to contusion.  Discussed negative x-ray findings with patient.  Patient given discharge care instruction and a prescription for  naproxen.  Patient advised follow-up with the [facility] if condition persists.      ____________________________________________   FINAL CLINICAL IMPRESSION(S) / ED DIAGNOSES  Final diagnoses:  Contusion of left leg, initial encounter     ED Discharge Orders        Ordered    naproxen (NAPROSYN) 500 MG tablet  2 times daily with meals     11/22/17 1310       Note:  This document was prepared using Dragon voice recognition software and may include unintentional dictation errors.    Joni Reining, PA-C 11/22/17 1312    Emily Filbert, MD 11/22/17 [phone]

## 2018-01-13 ENCOUNTER — Emergency Department
Admission: EM | Admit: 2018-01-13 | Discharge: 2018-01-14 | Disposition: A | Discharge status: Home / Self Care | Payer: Medicaid Other | Attending: Emergency Medicine | Admitting: Emergency Medicine

## 2018-01-13 ENCOUNTER — Emergency Department: Payer: Medicaid Other

## 2018-01-13 ENCOUNTER — Encounter: Payer: Self-pay | Admitting: Emergency Medicine

## 2018-01-13 DIAGNOSIS — R51 Headache: Secondary | ICD-10-CM | POA: Diagnosis not present

## 2018-01-13 DIAGNOSIS — Z87891 Personal history of nicotine dependence: Secondary | ICD-10-CM | POA: Insufficient documentation

## 2018-01-13 DIAGNOSIS — R519 Headache, unspecified: Secondary | ICD-10-CM

## 2018-01-13 MED ORDER — BUTALBITAL-APAP-CAFFEINE 50-325-40 MG PO TABS
2.0000 | ORAL_TABLET | Freq: Once | ORAL | Status: AC
  Administered 2018-01-13: 2 via ORAL
  Filled 2018-01-13: qty 2

## 2018-01-13 NOTE — ED Triage Notes (Signed)
Patient with complaint of headache times two weeks. Patient denies history of migraines. Patient states that this headache feels different then previous headaches. Patient states that she had taken IBU, tylenol and naproxen with no relief.

## 2018-01-13 NOTE — ED Provider Notes (Signed)
[facility] Emergency Department Provider Note  ____________________________________________   First MD Initiated Contact with Patient 01/13/18 2305     (approximate)  I have reviewed the triage vital signs and the nursing notes.   HISTORY  Chief Complaint Headache   HPI Joan Moreno is a 27 y.o. female who comes to the emergency department with 2 weeks of headache.  Her headache is bifrontal radiating to bilateral temples moderate severity throbbing aching and constant.  She has never had headaches before and her symptoms have lasted 2 weeks which prompted the visit.  No fevers or chills.  No numbness or weakness.  No double vision or blurred vision.  She has taken ibuprofen, Aleve, and Tylenol with no relief.  She denies trauma.  She is concerned that she could have a brain tumor.  Nothing particular seems to make the symptoms better or worse.  Past Medical History:  Diagnosis Date  . Medical history non-contributory   . Personal history of asthma    as a child    There are no active problems to display for this patient.   Past Surgical History:  Procedure Laterality Date  . broke leg     put to sleep to put back in place  . LAPAROSCOPIC TUBAL LIGATION Bilateral 08/15/2017   Procedure: LAPAROSCOPIC BILATERAL TUBAL LIGATION;  Surgeon: Hildred Laserherry, Anika, MD;  Location: [facility] ORS;  Service: Gynecology;  Laterality: Bilateral;  . TONSILLECTOMY      Prior to Admission medications   Medication Sig Start Date End Date Taking? Authorizing Provider  acetaminophen (TYLENOL) 325 MG tablet Take 325-650 mg by mouth every 6 (six) hours as needed for moderate pain or headache.    [provider]  docusate sodium (COLACE) 100 MG capsule Take 1 capsule (100 mg total) by mouth 2 (two) times daily as needed for mild constipation. Patient not taking: Reported on 08/23/2017 08/15/17   Hildred Laserherry, Anika, MD  ibuprofen (ADVIL,MOTRIN) 800 MG tablet Take 1 tablet (800  mg total) by mouth every 8 (eight) hours as needed for mild pain. 08/15/17   Hildred Laserherry, Anika, MD  Multiple Vitamin (MULTIVITAMIN) tablet Take 1 tablet by mouth daily.    [provider]  naproxen (NAPROSYN) 500 MG tablet Take 1 tablet (500 mg total) by mouth 2 (two) times daily with a meal. 11/22/17   Joni ReiningSmith, Ronald K, PA-C  oxyCODONE (OXY IR/ROXICODONE) 5 MG immediate release tablet Take 1 tablet (5 mg total) by mouth every 4 (four) hours as needed (for pain score of 1-4). 08/15/17   Hildred Laserherry, Anika, MD    Allergies Stadol [butorphanol] and Sulfa antibiotics  Family History  Problem Relation Age of Onset  . Diabetes Mother        borderline  . Diabetes Maternal Grandfather        aunt, uncle    Social History Social History   Tobacco Use  . Smoking status: Former Smoker    Packs/day: 0.50    Years: 0.50    Pack years: 0.25    Types: Cigarettes    Last attempt to quit: 10/06/2014    Years since quitting: 3.2  . Smokeless tobacco: Never Used  Substance Use Topics  . Alcohol use: No    Alcohol/week: 0.0 oz  . Drug use: No    Review of Systems Constitutional: No fever/chills Eyes: No visual changes. Moreno: No sore throat. Cardiovascular: Denies chest pain. Respiratory: Denies shortness of breath. Gastrointestinal: No abdominal pain.  No nausea, no vomiting.  No diarrhea.  No constipation. Genitourinary: Negative for dysuria. Musculoskeletal: Negative for back pain. Skin: Negative for rash. Neurological: Positive for headache   ____________________________________________   PHYSICAL EXAM:  VITAL SIGNS: ED Triage Vitals  Enc Vitals Group     BP 01/13/18 2105 125/72     Pulse Rate 01/13/18 2105 86     Resp 01/13/18 2105 18     Temp 01/13/18 2105 98.2 F (36.8 C)     Temp Source 01/13/18 2105 Oral     SpO2 01/13/18 2105 100 %     Weight 01/13/18 2106 140 lb (63.5 kg)     Height 01/13/18 2106 5\' 6"  (1.676 m)     Head Circumference --      Peak Flow --       Pain Score 01/13/18 2106 6     Pain Loc --      Pain Edu? --      Excl. in GC? --     Constitutional: Alert and oriented x4 pleasant cooperative speaks in full clear sentences no diaphoresis Eyes: PERRL EOMI. mid range and brisk Head: Atraumatic. Nose: No congestion/rhinnorhea. Mouth/Throat: No trismus Neck: No stridor.  No meningismus Cardiovascular: Normal rate, regular rhythm. Grossly normal heart sounds.  Good peripheral circulation. Respiratory: Normal respiratory effort.  No retractions. Lungs CTAB and moving good air Gastrointestinal: Soft nontender Musculoskeletal: No lower extremity edema   Neurologic:  Normal speech and language. No gross focal neurologic deficits are appreciated. Skin:  Skin is warm, dry and intact. No rash noted. Psychiatric: Mood and affect are normal. Speech and behavior are normal.    ____________________________________________   DIFFERENTIAL includes but not limited to  Intracerebral hemorrhage, glaucoma, temporal arteritis, meningitis, tension headache ____________________________________________   LABS (all labs ordered are listed, but only abnormal results are displayed)  Labs Reviewed - No data to display   __________________________________________  EKG   ____________________________________________  RADIOLOGY  Head CT reviewed by me with no acute disease ____________________________________________   PROCEDURES  Procedure(s) performed: no  Procedures  Critical Care performed: no  Observation: no ____________________________________________   INITIAL IMPRESSION / ASSESSMENT AND PLAN / ED COURSE  Pertinent labs & imaging results that were available during my care of the patient were reviewed by me and considered in my medical decision making (see chart for details).  The patient arrives neuro intact although with 2 weeks of a headache unlike any headache she is ever had before.  I do think head CT is reasonable as  a first step now.     ----------------------------------------- 1:04 AM on 01/14/2018 -----------------------------------------  The patient's pain is only minimally improved after Fioricet.  Fortunately her head CT is reassuring.  She has no meningismus.  Normal vision.  Normal neuro exam.  I had a lengthy discussion with the patient regarding the diagnostic uncertainty and the importance of establishing care with primary care this coming week for reevaluation ____________________________________________   FINAL CLINICAL IMPRESSION(S) / ED DIAGNOSES  Final diagnoses:  Nonintractable headache, unspecified chronicity pattern, unspecified headache type      NEW MEDICATIONS STARTED DURING THIS VISIT:  Discharge Medication List as of 01/14/2018  1:04 AM       Note:  This document was prepared using Dragon voice recognition software and may include unintentional dictation errors.     Merrily Brittle, MD 01/14/18 [phone]

## 2018-01-13 NOTE — ED Notes (Signed)
Pt states headache "under my eyes" for two weeks. Pt denies photophobia, sensitivity to sound, nausea, vomiting, sinus drainage or fever. Pt appears in no acute distress. Pt states she has had intermittent "tingling feeling in my whole head". Pt moving all extremities, appears in no acute distress.

## 2018-01-14 NOTE — Discharge Instructions (Signed)
Fortunately today your head CT was reassuring.  Please continue taking ibuprofen and Tylenol as needed for severe symptoms and make an appointment to establish care with primary care this coming week for reevaluation.  Return to the emergency department for any concerns such as fevers, chills, worsening pain, numbness, weakness, or for any other issues whatsoever.  It was a pleasure to take care of you today, and thank you for coming to our emergency department.  If you have any questions or concerns before leaving please ask the nurse to grab me and I'm more than happy to go through your aftercare instructions again.  If you were prescribed any opioid pain medication today such as Norco, Vicodin, Percocet, morphine, hydrocodone, or oxycodone please make sure you do not drive when you are taking this medication as it can alter your ability to drive safely.  If you have any concerns once you are home that you are not improving or are in fact getting worse before you can make it to your follow-up appointment, please do not hesitate to call 911 and come back for further evaluation.  Merrily BrittleNeil Bonifacio Pruden, MD  Results for orders placed or performed during the hospital encounter of 08/15/17  Pregnancy, urine POC  Result Value Ref Range   Preg Test, Ur NEGATIVE NEGATIVE   Ct Head Wo Contrast  Result Date: 01/13/2018 CLINICAL DATA:  [age] year old female with altered mental status. EXAM: CT HEAD WITHOUT CONTRAST TECHNIQUE: Contiguous axial images were obtained from the base of the skull through the vertex without intravenous contrast. COMPARISON:  None. FINDINGS: Brain: No evidence of acute infarction, hemorrhage, hydrocephalus, extra-axial collection or mass lesion/mass effect. Vascular: No hyperdense vessel or unexpected calcification. Skull: Normal. Negative for fracture or focal lesion. Sinuses/Orbits: No acute finding. Other: None IMPRESSION: Normal noncontrast CT of the brain. Electronically Signed   By: Elgie CollardArash   Radparvar M.D.   On: 01/13/2018 23:27

## 2018-03-13 ENCOUNTER — Encounter: Payer: Self-pay | Admitting: Emergency Medicine

## 2018-03-13 ENCOUNTER — Emergency Department: Payer: Medicaid Other

## 2018-03-13 ENCOUNTER — Other Ambulatory Visit: Payer: Self-pay

## 2018-03-13 ENCOUNTER — Emergency Department
Admission: EM | Admit: 2018-03-13 | Discharge: 2018-03-13 | Disposition: A | Discharge status: Home / Self Care | Payer: Medicaid Other | Attending: Emergency Medicine | Admitting: Emergency Medicine

## 2018-03-13 DIAGNOSIS — M542 Cervicalgia: Secondary | ICD-10-CM | POA: Insufficient documentation

## 2018-03-13 DIAGNOSIS — M546 Pain in thoracic spine: Secondary | ICD-10-CM | POA: Diagnosis not present

## 2018-03-13 DIAGNOSIS — M25532 Pain in left wrist: Secondary | ICD-10-CM | POA: Diagnosis not present

## 2018-03-13 MED ORDER — CYCLOBENZAPRINE HCL 10 MG PO TABS: 10.0000 mg | ORAL_TABLET | Freq: Three times a day (TID) | ORAL | 0 refills | Status: AC | PRN

## 2018-03-13 MED ORDER — MELOXICAM 15 MG PO TABS: 15.0000 mg | ORAL_TABLET | Freq: Every day | ORAL | 1 refills | Status: AC
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
## 2018-12-14 ENCOUNTER — Emergency Department: Payer: Medicaid Other

## 2018-12-14 ENCOUNTER — Encounter: Payer: Self-pay | Admitting: Emergency Medicine

## 2018-12-14 ENCOUNTER — Emergency Department
Admission: EM | Admit: 2018-12-14 | Discharge: 2018-12-14 | Disposition: A | Discharge status: Home / Self Care | Payer: Medicaid Other | Attending: Emergency Medicine | Admitting: Emergency Medicine

## 2018-12-14 ENCOUNTER — Other Ambulatory Visit: Payer: Self-pay

## 2018-12-14 DIAGNOSIS — Z87891 Personal history of nicotine dependence: Secondary | ICD-10-CM | POA: Insufficient documentation

## 2018-12-14 DIAGNOSIS — N946 Dysmenorrhea, unspecified: Secondary | ICD-10-CM | POA: Diagnosis not present

## 2018-12-14 DIAGNOSIS — R102 Pelvic and perineal pain: Secondary | ICD-10-CM

## 2018-12-14 DIAGNOSIS — R1031 Right lower quadrant pain: Secondary | ICD-10-CM | POA: Diagnosis present

## 2018-12-14 LAB — URINALYSIS, COMPLETE (UACMP) WITH MICROSCOPIC
BACTERIA UA: NONE SEEN
Bilirubin Urine: NEGATIVE
GLUCOSE, UA: NEGATIVE mg/dL
KETONES UR: NEGATIVE mg/dL
Leukocytes,Ua: NEGATIVE
Nitrite: NEGATIVE
PROTEIN: NEGATIVE mg/dL
Specific Gravity, Urine: 1.015 (ref 1.005–1.030)
pH: 8 (ref 5.0–8.0)

## 2018-12-14 LAB — POCT PREGNANCY, URINE: PREG TEST UR: NEGATIVE

## 2018-12-14 MED ORDER — NAPROXEN 500 MG PO TABS: 500.0000 mg | ORAL_TABLET | Freq: Two times a day (BID) | ORAL | 0 refills | Status: DC

## 2018-12-14 NOTE — ED Provider Notes (Signed)
[facility] Emergency Department Provider Note  ____________________________________________  Time seen: Approximately 5:11 PM  I have reviewed the triage vital signs and the nursing notes.   HISTORY  Chief Complaint Flank Pain    HPI Joan Moreno is a 28 y.o. female who presents to the emergency department for treatment and evaluation of right lower abdominal pain.  Pain started yesterday and has been a constant dull ache with an occasional stabbing pain throughout the night last night and today.  No relief with Tylenol or ibuprofen.  She is currently on her menstrual cycle but states that this does not feel normal.  She has had no nausea, vomiting, or fever.  Appetite is unchanged.  Past Medical History:  Diagnosis Date  . Medical history non-contributory   . Personal history of asthma    as a child    There are no active problems to display for this patient.   Past Surgical History:  Procedure Laterality Date  . broke leg     put to sleep to put back in place  . LAPAROSCOPIC TUBAL LIGATION Bilateral 08/15/2017   Procedure: LAPAROSCOPIC BILATERAL TUBAL LIGATION;  Surgeon: Hildred Laser, MD;  Location: [facility] ORS;  Service: Gynecology;  Laterality: Bilateral;  . TONSILLECTOMY      Prior to Admission medications   Medication Sig Start Date End Date Taking? Authorizing Provider  acetaminophen (TYLENOL) 325 MG tablet Take 325-650 mg by mouth every 6 (six) hours as needed for moderate pain or headache.    [provider]  Multiple Vitamin (MULTIVITAMIN) tablet Take 1 tablet by mouth daily.    [provider]  naproxen (NAPROSYN) 500 MG tablet Take 1 tablet (500 mg total) by mouth 2 (two) times daily with a meal. 12/14/18   Arvle Grabe B, FNP    Allergies Stadol [butorphanol] and Sulfa antibiotics  Family History  Problem Relation Age of Onset  . Diabetes Mother        borderline  . Diabetes Maternal Grandfather        aunt,  uncle    Social History Social History   Tobacco Use  . Smoking status: Former Smoker    Packs/day: 0.50    Years: 0.50    Pack years: 0.25    Types: Cigarettes    Last attempt to quit: 10/06/2014    Years since quitting: 4.1  . Smokeless tobacco: Never Used  Substance Use Topics  . Alcohol use: No    Alcohol/week: 0.0 standard drinks  . Drug use: No    Review of Systems Constitutional: Negative for fever. Respiratory: Negative for shortness of breath or cough. Gastrointestinal: Positive for abdominal pain; negative for nausea , negative for vomiting. Genitourinary: Negative for dysuria , negative for vaginal discharge.  Positive for menstrual bleeding Musculoskeletal: Negative for back pain. Skin: Negative for acute skin changes/rash/lesion. ____________________________________________   PHYSICAL EXAM:  VITAL SIGNS: ED Triage Vitals  Enc Vitals Group     BP 12/14/18 1625 114/76     Pulse Rate 12/14/18 1625 77     Resp 12/14/18 1625 18     Temp 12/14/18 1625 98.8 F (37.1 C)     Temp Source 12/14/18 1625 Oral     SpO2 12/14/18 1625 99 %     Weight 12/14/18 1626 160 lb (72.6 kg)     Height 12/14/18 1626 5\' 6"  (1.676 m)     Head Circumference --      Peak Flow --  Pain Score 12/14/18 1636 6     Pain Loc --      Pain Edu? --      Excl. in GC? --     Constitutional: Alert and oriented. Well appearing and in no acute distress. Eyes: Conjunctivae are normal. Head: Atraumatic. Nose: No congestion/rhinnorhea. Mouth/Throat: Mucous membranes are moist. Respiratory: Normal respiratory effort.  No retractions. Gastrointestinal: Bowel sounds active x 4; Abdomen is soft without rebound or guarding.  Negative psoas sign.  Negative heel strike. Genitourinary: Pelvic exam: Not indicated Musculoskeletal: No extremity tenderness nor edema.  Neurologic:  Normal speech and language. No gross focal neurologic deficits are appreciated. Speech is normal. No gait  instability. Skin:  Skin is warm, dry and intact. No rash noted on exposed skin. Psychiatric: Mood and affect are normal. Speech and behavior are normal.  ____________________________________________   LABS (all labs ordered are listed, but only abnormal results are displayed)  Labs Reviewed  URINALYSIS, COMPLETE (UACMP) WITH MICROSCOPIC - Abnormal; Notable for the following components:      Result Value   Color, Urine YELLOW (*)    APPearance CLOUDY (*)    Hgb urine dipstick MODERATE (*)    All other components within normal limits  POC URINE PREG, ED  POCT PREGNANCY, URINE   ____________________________________________  RADIOLOGY  [place] negative for acute findings. ____________________________________________  Procedures  ____________________________________________  28 year old female presents to the emergency department for treatment and evaluation of right lower quadrant pain/pelvic pain that started last night. She has a history of tubal ligation. Symptoms and exam are not consistent with appendicitis because she has no nausea, vomiting, fever, decreased appetite, or pain with procedures that would elicit peritoneal irritation. Will proceed with [place] to look at ovaries and uterus since she has had a tubal ligation.  [place] results reviewed with the patient. Current symptoms most likely dysmenorrhea. She will be treated with naprosyn. She was advised to follow up with primary care or gynecology for symptoms that are not improving over the next few days. She is to return to the ER for symptoms that change or worsen if unable to schedule an appointment.  INITIAL IMPRESSION / ASSESSMENT AND PLAN / ED COURSE  Pertinent labs & imaging results that were available during my care of the patient were reviewed by me and considered in my medical decision making (see chart for details).  ____________________________________________   FINAL CLINICAL IMPRESSION(S) / ED DIAGNOSES  Final  diagnoses:  Dysmenorrhea    Note:  This document was prepared using Dragon voice recognition software and may include unintentional dictation errors.    Chinita Pester, FNP 12/14/18 1842    Minna Antis, MD 12/14/18 2329

## 2018-12-14 NOTE — ED Triage Notes (Signed)
PT c/o RT sided flank pain and headache. Pt denies n/v/d. NAD noted

## 2018-12-14 NOTE — Discharge Instructions (Signed)
Please follow-up with your primary care provider or the gynecologist listed above for symptoms that are not improving over the next few days.  Return to the emergency department for symptoms of change or worsen if you are unable to schedule an appointment.

## 2019-08-17 ENCOUNTER — Other Ambulatory Visit: Payer: Self-pay | Admitting: Otolaryngology

## 2019-08-17 DIAGNOSIS — E041 Nontoxic single thyroid nodule: Secondary | ICD-10-CM

## 2019-08-24 ENCOUNTER — Ambulatory Visit
Admission: RE | Admit: 2019-08-24 | Discharge: 2019-08-24 | Disposition: A | Discharge status: Home / Self Care | Payer: Medicaid Other | Source: Ambulatory Visit | Attending: Otolaryngology | Admitting: Otolaryngology

## 2019-08-24 ENCOUNTER — Other Ambulatory Visit: Payer: Self-pay

## 2019-08-24 DIAGNOSIS — E041 Nontoxic single thyroid nodule: Secondary | ICD-10-CM | POA: Diagnosis present

## 2019-08-29 ENCOUNTER — Other Ambulatory Visit: Payer: Self-pay | Admitting: Otolaryngology

## 2019-08-29 DIAGNOSIS — E041 Nontoxic single thyroid nodule: Secondary | ICD-10-CM

## 2019-09-03 ENCOUNTER — Other Ambulatory Visit: Payer: Self-pay

## 2019-09-03 ENCOUNTER — Ambulatory Visit
Admission: RE | Admit: 2019-09-03 | Discharge: 2019-09-03 | Disposition: A | Discharge status: Home / Self Care | Payer: Medicaid Other | Source: Ambulatory Visit | Attending: Otolaryngology | Admitting: Otolaryngology

## 2019-09-03 DIAGNOSIS — E041 Nontoxic single thyroid nodule: Secondary | ICD-10-CM | POA: Diagnosis present

## 2019-09-03 NOTE — Procedures (Signed)
Successful US guided FNA x 6 (R)mid thyroid nodule No complications.  Ascencion Dike PA-C Interventional Radiology 09/03/2019 1:57 PM

## 2019-09-04 LAB — CYTOLOGY - NON PAP

## 2020-03-17 DIAGNOSIS — Z87891 Personal history of nicotine dependence: Secondary | ICD-10-CM | POA: Diagnosis not present

## 2020-03-17 DIAGNOSIS — N12 Tubulo-interstitial nephritis, not specified as acute or chronic: Secondary | ICD-10-CM | POA: Diagnosis not present

## 2020-03-17 DIAGNOSIS — J45909 Unspecified asthma, uncomplicated: Secondary | ICD-10-CM | POA: Insufficient documentation

## 2020-03-17 DIAGNOSIS — R109 Unspecified abdominal pain: Secondary | ICD-10-CM | POA: Diagnosis present

## 2020-03-17 LAB — COMPREHENSIVE METABOLIC PANEL
ALT: 14 U/L (ref 0–44)
AST: 16 U/L (ref 15–41)
Albumin: 4.7 g/dL (ref 3.5–5.0)
Alkaline Phosphatase: 115 U/L (ref 38–126)
Anion gap: 9 (ref 5–15)
BUN: 15 mg/dL (ref 6–20)
CO2: 28 mmol/L (ref 22–32)
Calcium: 9.5 mg/dL (ref 8.9–10.3)
Chloride: 100 mmol/L (ref 98–111)
Creatinine, Ser: 0.84 mg/dL (ref 0.44–1.00)
GFR calc Af Amer: >60 mL/min (ref >60)
GFR calc non Af Amer: >60 mL/min (ref >60)
Glucose, Bld: 122 mg/dL — ABNORMAL HIGH (ref 70–99)
Potassium: 3.6 mmol/L (ref 3.5–5.1)
Sodium: 137 mmol/L (ref 135–145)
Total Bilirubin: 0.8 mg/dL (ref 0.3–1.2)
Total Protein: 8.5 g/dL — ABNORMAL HIGH (ref 6.5–8.1)

## 2020-03-17 LAB — URINALYSIS, COMPLETE (UACMP) WITH MICROSCOPIC
Bilirubin Urine: NEGATIVE
Glucose, UA: NEGATIVE mg/dL
Ketones, ur: NEGATIVE mg/dL
Nitrite: NEGATIVE
Protein, ur: 100 mg/dL — AB
Specific Gravity, Urine: 1.019 (ref 1.005–1.030)
WBC, UA: >50 WBC/hpf — ABNORMAL HIGH (ref 0–5)
pH: 6 (ref 5.0–8.0)

## 2020-03-17 LAB — CBC
HCT: 43.4 % (ref 36.0–46.0)
Hemoglobin: 14 g/dL (ref 12.0–15.0)
MCH: 28.1 pg (ref 26.0–34.0)
MCHC: 32.3 g/dL (ref 30.0–36.0)
MCV: 87 fL (ref 80.0–100.0)
Platelets: 266 10*3/uL (ref 150–400)
RBC: 4.99 MIL/uL (ref 3.87–5.11)
RDW: 13.5 % (ref 11.5–15.5)
WBC: 16.3 10*3/uL — ABNORMAL HIGH (ref 4.0–10.5)
nRBC: 0 % (ref 0.0–0.2)

## 2020-03-17 LAB — POCT PREGNANCY, URINE: Preg Test, Ur: NEGATIVE

## 2020-03-17 LAB — LIPASE, BLOOD: Lipase: 39 U/L (ref 11–51)

## 2020-03-17 NOTE — ED Triage Notes (Signed)
Pt c/o lower back pain that radiates into both flank areas as well as urinary frequency and dysuria x2 days.

## 2020-03-18 ENCOUNTER — Emergency Department: Payer: Medicaid Other

## 2020-03-18 ENCOUNTER — Emergency Department
Admission: EM | Admit: 2020-03-18 | Discharge: 2020-03-18 | Disposition: A | Discharge status: Home / Self Care | Payer: Medicaid Other | Attending: Emergency Medicine | Admitting: Emergency Medicine

## 2020-03-18 DIAGNOSIS — N12 Tubulo-interstitial nephritis, not specified as acute or chronic: Secondary | ICD-10-CM

## 2020-03-18 LAB — PROCALCITONIN: Procalcitonin: <0.1 ng/mL

## 2020-03-18 LAB — LACTIC ACID, PLASMA: Lactic Acid, Venous: 1.1 mmol/L (ref 0.5–1.9)

## 2020-03-18 MED ORDER — CEPHALEXIN 500 MG PO CAPS
500.0000 mg | ORAL_CAPSULE | Freq: Four times a day (QID) | ORAL | 0 refills | Status: AC
Start: 2020-03-18 — End: 2020-03-30

## 2020-03-18 MED ORDER — SODIUM CHLORIDE 0.9 % IV SOLN
1.0000 g | INTRAVENOUS | Status: AC
  Administered 2020-03-18: 1 g via INTRAVENOUS
  Filled 2020-03-18: qty 10

## 2020-03-18 MED ORDER — LACTATED RINGERS IV BOLUS: 1000.0000 mL | Freq: Once | INTRAVENOUS | Status: DC

## 2020-03-18 MED ORDER — ONDANSETRON 4 MG PO TBDP
ORAL_TABLET | ORAL | 0 refills | Status: AC
Start: 2020-03-18 — End: ?

## 2020-03-18 MED ORDER — KETOROLAC TROMETHAMINE 30 MG/ML IJ SOLN
15.0000 mg | Freq: Once | INTRAMUSCULAR | Status: AC
Start: 2020-03-18 — End: 2020-03-18
  Administered 2020-03-18: 15 mg via INTRAVENOUS
  Filled 2020-03-18: qty 1

## 2020-03-18 NOTE — Discharge Instructions (Signed)
Your workup today suggests that you have a urinary tract infection (UTI) which has spread to your kidneys.  Please take your antibiotic as prescribed and over-the-counter pain medication (Tylenol or Motrin) as needed, but no more than recommended on the label instructions.  Drink PLENTY of fluids.  Call your regular doctor to schedule the next available appointment to follow up on today's ED visit, or return immediately to the ED if your pain worsens, you have decreased urine production, develop fever, persistent vomiting, or other symptoms that concern you.  

## 2020-03-18 NOTE — ED Provider Notes (Signed)
[facility] ([place]) Emergency Department Provider Note  ____________________________________________   First MD Initiated Contact with Patient 03/18/20 0010     (approximate)  I have reviewed the triage vital signs and the nursing notes.   HISTORY  Chief Complaint Flank Pain    HPI Joan Moreno is a 29 y.o. female who reports no ongoing issues but who has had multiple urinary tract infections in the past.  She presents tonight for evaluation of about 24 hours of pain when she urinates, increased urinary frequency, darker urine than usual, and pain that radiates into both flanks.  She has also had subjective fever but no measured elevated temperature.  She has had some nausea but no vomiting.  No anterior abdominal pain.  She denies sore throat, chest pain, shortness of breath, cough.  Nothing particular makes the symptoms better or worse than they were moderate to severe tonight, gradually getting worse over the last 24 hours.  She has no history of kidney stones but reports that the sharp pain in her flanks is at times very severe.  Currently it is mild.         Past Medical History:  Diagnosis Date  . Medical history non-contributory   . Personal history of asthma    as a child    There are no problems to display for this patient.   Past Surgical History:  Procedure Laterality Date  . broke leg     put to sleep to put back in place  . LAPAROSCOPIC TUBAL LIGATION Bilateral 08/15/2017   Procedure: LAPAROSCOPIC BILATERAL TUBAL LIGATION;  Surgeon: Hildred Laser, MD;  Location: [facility] ORS;  Service: Gynecology;  Laterality: Bilateral;  . TONSILLECTOMY      Prior to Admission medications   Medication Sig Start Date End Date Taking? Authorizing Provider  acetaminophen (TYLENOL) 325 MG tablet Take 325-650 mg by mouth every 6 (six) hours as needed for moderate pain or headache.    [provider]  cephALEXin (KEFLEX) 500 MG capsule Take 1 capsule  (500 mg total) by mouth 4 (four) times daily for 12 days. 03/18/20 03/30/20  Loleta Rose, MD  Multiple Vitamin (MULTIVITAMIN) tablet Take 1 tablet by mouth daily.    [provider]  naproxen (NAPROSYN) 500 MG tablet Take 1 tablet (500 mg total) by mouth 2 (two) times daily with a meal. 12/14/18   Triplett, Cari B, FNP  ondansetron (ZOFRAN ODT) 4 MG disintegrating tablet Allow 1-2 tablets to dissolve in your mouth every 8 hours as needed for nausea/vomiting 03/18/20   Loleta Rose, MD    Allergies Stadol [butorphanol] and Sulfa antibiotics  Family History  Problem Relation Age of Onset  . Diabetes Mother        borderline  . Diabetes Maternal Grandfather        aunt, uncle    Social History Social History   Tobacco Use  . Smoking status: Former Smoker    Packs/day: 0.50    Years: 0.50    Pack years: 0.25    Types: Cigarettes    Quit date: 10/06/2014    Years since quitting: 5.4  . Smokeless tobacco: Never Used  Substance Use Topics  . Alcohol use: No    Alcohol/week: 0.0 standard drinks  . Drug use: No    Review of Systems Constitutional: Subjective fever/chills Eyes: No visual changes. ENT: No sore throat. Cardiovascular: Denies chest pain. Respiratory: Denies shortness of breath. Gastrointestinal: No abdominal pain.  Nausea, no vomiting.  No  diarrhea.  No constipation. Genitourinary: Dysuria and increased urinary frequency and dark-colored urine but no gross blood. Musculoskeletal: Bilateral flank pain. Integumentary: Negative for rash. Neurological: Negative for headaches, focal weakness or numbness.   ____________________________________________   PHYSICAL EXAM:  VITAL SIGNS: ED Triage Vitals [03/17/20 2058]  Enc Vitals Group     BP 131/81     Pulse Rate (!) 115     Resp 20     Temp 98.3 F (36.8 C)     Temp Source Oral     SpO2 100 %     Weight 68 kg (150 lb)     Height 1.676 m (5\' 6" )     Head Circumference      Peak Flow      Pain Score       Pain Loc      Pain Edu?      Excl. in [place]?     Constitutional: Alert and oriented.  Eyes: Conjunctivae are normal.  Head: Atraumatic. Nose: No congestion/rhinnorhea. Mouth/Throat: Patient is wearing a mask. Neck: No stridor.  No meningeal signs.   Cardiovascular: Mild tachycardia, regular rhythm. Good peripheral circulation. Grossly normal heart sounds. Respiratory: Normal respiratory effort.  No retractions. Gastrointestinal: Soft and nontender. No distention.  Musculoskeletal: Bilateral CVA tenderness to percussion.  No lower extremity tenderness nor edema. No gross deformities of extremities. Neurologic:  Normal speech and language. No gross focal neurologic deficits are appreciated.  Skin:  Skin is warm, dry and intact. Psychiatric: Mood and affect are normal. Speech and behavior are normal.  ____________________________________________   LABS (all labs ordered are listed, but only abnormal results are displayed)  Labs Reviewed  COMPREHENSIVE METABOLIC PANEL - Abnormal; Notable for the following components:      Result Value   Glucose, Bld 122 (*)    Total Protein 8.5 (*)    All other components within normal limits  CBC - Abnormal; Notable for the following components:   WBC 16.3 (*)    All other components within normal limits  URINALYSIS, COMPLETE (UACMP) WITH MICROSCOPIC - Abnormal; Notable for the following components:   Color, Urine AMBER (*)    APPearance TURBID (*)    Hgb urine dipstick SMALL (*)    Protein, ur 100 (*)    Leukocytes,Ua LARGE (*)    WBC, UA >50 (*)    Bacteria, UA FEW (*)    Non Squamous Epithelial PRESENT (*)    All other components within normal limits  URINE CULTURE  LIPASE, BLOOD  LACTIC ACID, PLASMA  PROCALCITONIN  POCT PREGNANCY, URINE  POC URINE PREG, ED   ____________________________________________  EKG  No indication for emergent EKG ____________________________________________  RADIOLOGY I, Hinda Kehr, personally  viewed and evaluated these images (plain radiographs) as part of my medical decision making, as well as reviewing the written report by the radiologist.  ED MD interpretation: No emergent/acute abnormality identified  Official radiology report(s): CT Renal Stone Study  Result Date: 03/18/2020 CLINICAL DATA:  Flank pain EXAM: CT ABDOMEN AND PELVIS WITHOUT CONTRAST TECHNIQUE: Multidetector CT imaging of the abdomen and pelvis was performed following the standard protocol without IV contrast. COMPARISON:  None. FINDINGS: Lower chest: No acute abnormality. Hepatobiliary: No focal liver abnormality is seen. No gallstones, gallbladder wall thickening, or biliary dilatation. Pancreas: Unremarkable. No pancreatic ductal dilatation or surrounding inflammatory changes. Spleen: Normal in size without focal abnormality. Adrenals/Urinary Tract: Adrenal glands are within normal limits. Kidneys are well visualized without definitive renal calculi. No obstructive changes  are seen. Bladder is well distended. Stomach/Bowel: Appendix is within normal limits. No obstructive or inflammatory changes of the colon are noted. Small bowel and stomach are unremarkable. Vascular/Lymphatic: No significant vascular findings are present. No enlarged abdominal or pelvic lymph nodes. Reproductive: Changes of tubal ligation are noted. The uterus is otherwise within normal limits. Other: No abdominal wall hernia or abnormality. No abdominopelvic ascites. Musculoskeletal: No acute or significant osseous findings. IMPRESSION: No acute abnormality noted. Electronically Signed   By: Alcide Clever M.D.   On: 03/18/2020 00:47    ____________________________________________   PROCEDURES   Procedure(s) performed (including Critical Care):  Procedures   ____________________________________________   INITIAL IMPRESSION / MDM / ASSESSMENT AND PLAN / ED COURSE  As part of my medical decision making, I reviewed the following data within  the electronic MEDICAL RECORD NUMBER Nursing notes reviewed and incorporated, Labs reviewed , Old chart reviewed and Notes from prior ED visits   Differential diagnosis includes, but is not limited to, pyelonephritis/UTI, ureteral colic or infected/obstructive stone, pregnancy, STD/PID/TOA, musculoskeletal strain.  The patient is mildly tachycardic but afebrile.  Leukocytosis to 16.3.  I am sending a lactic acid and a pro calcitonin.  Comprehensive metabolic panel is within normal limits and urine pregnancy test is negative.  Urinalysis is grossly positive and I have ordered a urine culture as well.  Technically speaking she meets sepsis criteria but she is well-appearing and in no distress and she is young and otherwise healthy.  I suspect we can treat her as an outpatient assuming she has no obstructive stone.  She and I had the usual risk-benefit discussion regarding a CT renal stone protocol and she agrees with the plan for the scan.  Ordering lactated Ringer's 1 L IV bolus and ceftriaxone 1 g IV.  Also ordered Toradol 15 mg IV.       Clinical Course as of Mar 18 133  Tue Mar 18, 2020  0051 No evidence of obstructive uropathy and no CT evidence of pyelonephritis on this noncontrasted study.  Proceeding with plan and anticipate outpatient treatment.   [CF]  0128 Normal lactic acid  Lactic Acid, Venous: 1.1 [CF]  0132 Repeat vitals demonstrate HR 80 without fluids.  Patient tolerating oral intake, no need for IV fluids, cancelling bolus.  Updated patient about reassuring results, she agrees with the plan for d/c and outpatient follow up.   [CF]    Clinical Course User Index [CF] Loleta Rose, MD     ____________________________________________  FINAL CLINICAL IMPRESSION(S) / ED DIAGNOSES  Final diagnoses:  Pyelonephritis     MEDICATIONS GIVEN DURING THIS VISIT:  Medications  cefTRIAXone (ROCEPHIN) 1 g in sodium chloride 0.9 % 100 mL IVPB (1 g Intravenous New Bag/Given 03/18/20 0048)   ketorolac (TORADOL) 30 MG/ML injection 15 mg (15 mg Intravenous Given 03/18/20 0047)     ED Discharge Orders         Ordered    cephALEXin (KEFLEX) 500 MG capsule  4 times daily     03/18/20 0133    ondansetron (ZOFRAN ODT) 4 MG disintegrating tablet     03/18/20 0133          *Please note:  MALLERY HARSHMAN was evaluated in Emergency Department on 03/18/2020 for the symptoms described in the history of present illness. She was evaluated in the context of the global COVID-19 pandemic, which necessitated consideration that the patient might be at risk for infection with the SARS-CoV-2 virus that causes COVID-19. Institutional protocols  and algorithms that pertain to the evaluation of patients at risk for COVID-19 are in a state of rapid change based on information released by regulatory bodies including the CDC and federal and state organizations. These policies and algorithms were followed during the patient's care in the ED.  Some ED evaluations and interventions may be delayed as a result of limited staffing during the pandemic.*  Note:  This document was prepared using Dragon voice recognition software and may include unintentional dictation errors.   Loleta Rose, MD 03/18/20 [phone]

## 2020-03-20 LAB — URINE CULTURE
Culture: >=100000 — AB
Special Requests: NORMAL

## 2021-01-26 ENCOUNTER — Ambulatory Visit: Payer: Medicaid Other | Admitting: Cardiology

## 2021-01-28 ENCOUNTER — Encounter: Payer: Self-pay | Admitting: Cardiology

## 2021-03-14 IMAGING — CT CT RENAL STONE PROTOCOL
3 of 4 series · 7 of 46 positions shown, 13 images · non-contrast
Comparison: None.

CLINICAL DATA: Flank pain

EXAM:
CT ABDOMEN AND PELVIS WITHOUT CONTRAST
TECHNIQUE: Multidetector CT imaging of the abdomen and pelvis was performed
following the standard protocol without IV contrast.

[Series 4: lung bases · axial · 0.71mm/px · z∈[-136,-96]mm · 3 of 17 slices shown, 7 images]
[im 5/17  soft-tissue]
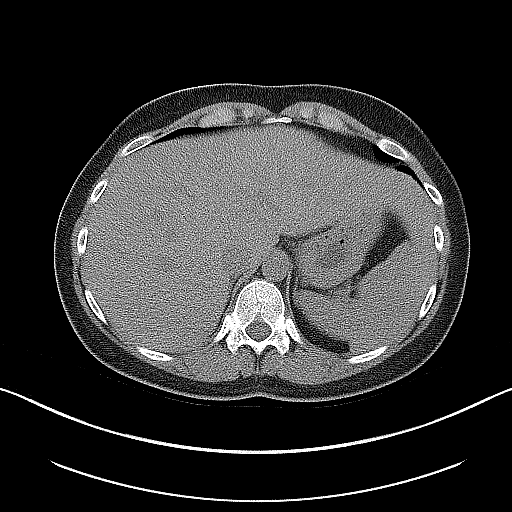
[im 5/17  lung]
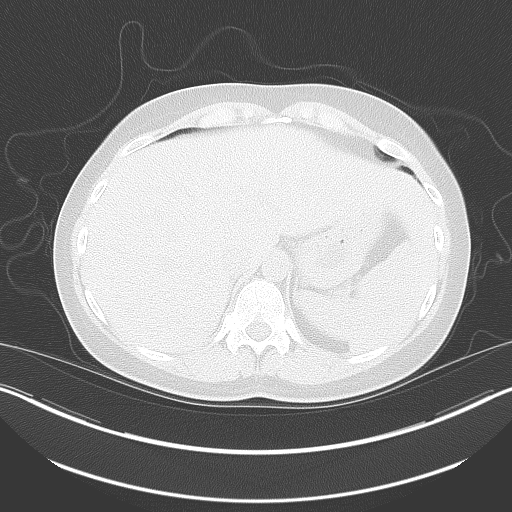
[im 5/17  bone]
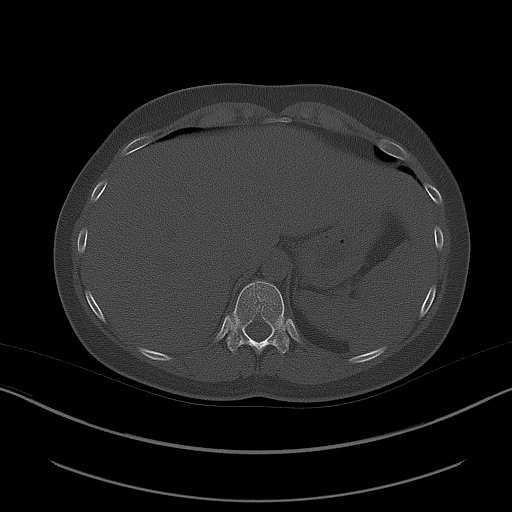
[im 9/17  soft-tissue]
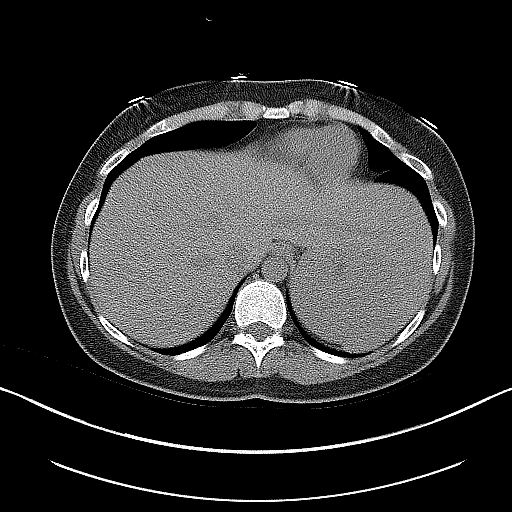
[im 9/17  lung]
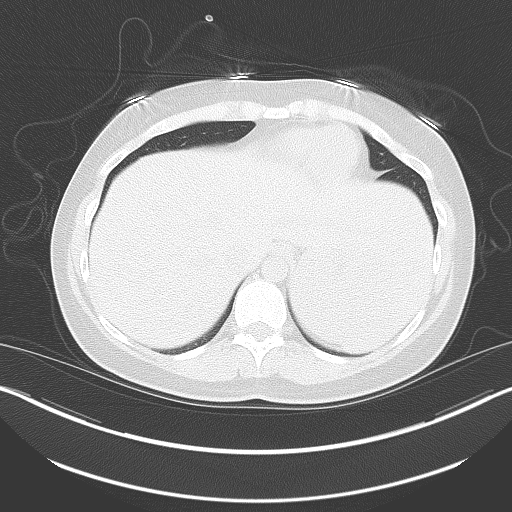
[im 13/17  soft-tissue]
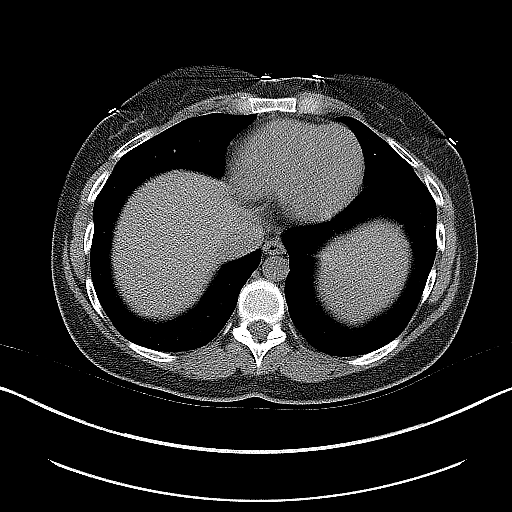
[im 13/17  lung]
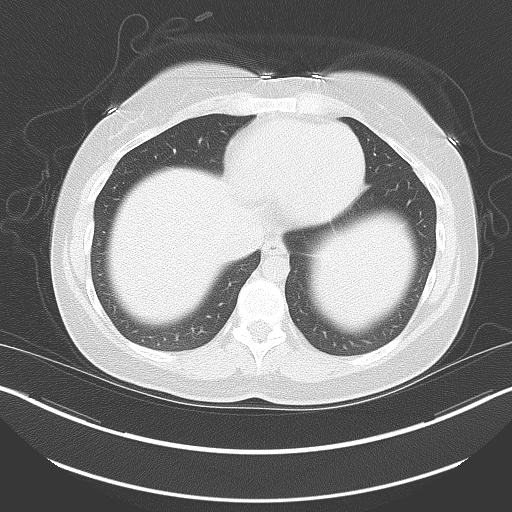

[Series 5: coronal · coronal · 0.79mm/px · 3 of 106 slices shown, 4 images]
[im 36/106  soft-tissue]
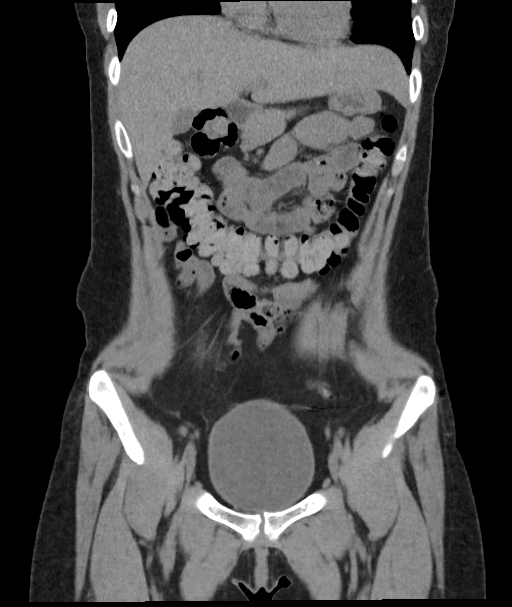
[im 47/106  soft-tissue]
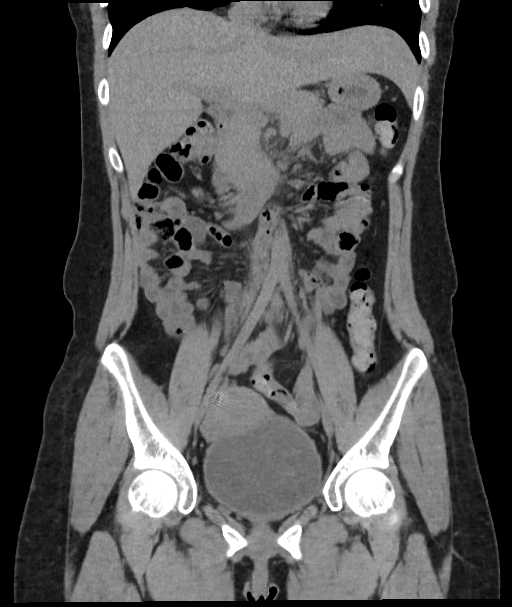
[im 47/106  bone]
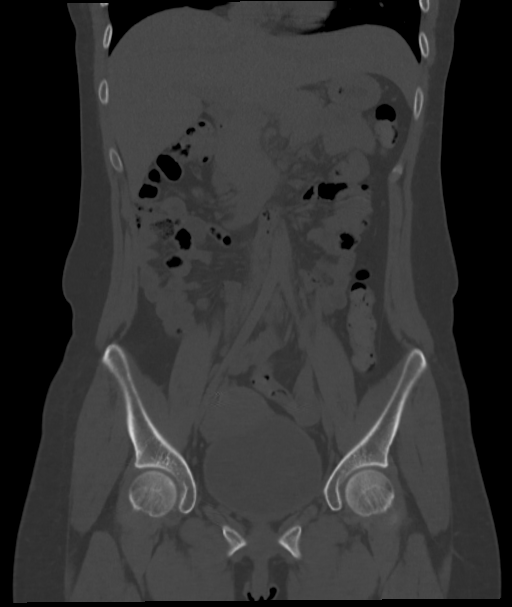
[im 59/106  soft-tissue]
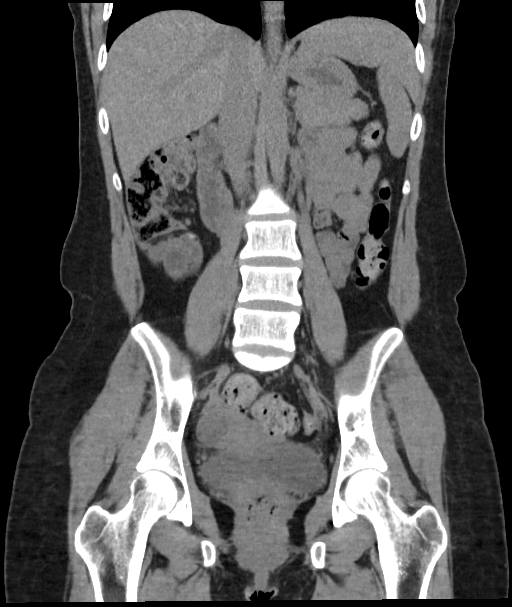

[Series 6: sagittal · sagittal · 0.52mm/px · 1 of 150 slices shown, 2 images]
[im 50/150  soft-tissue]
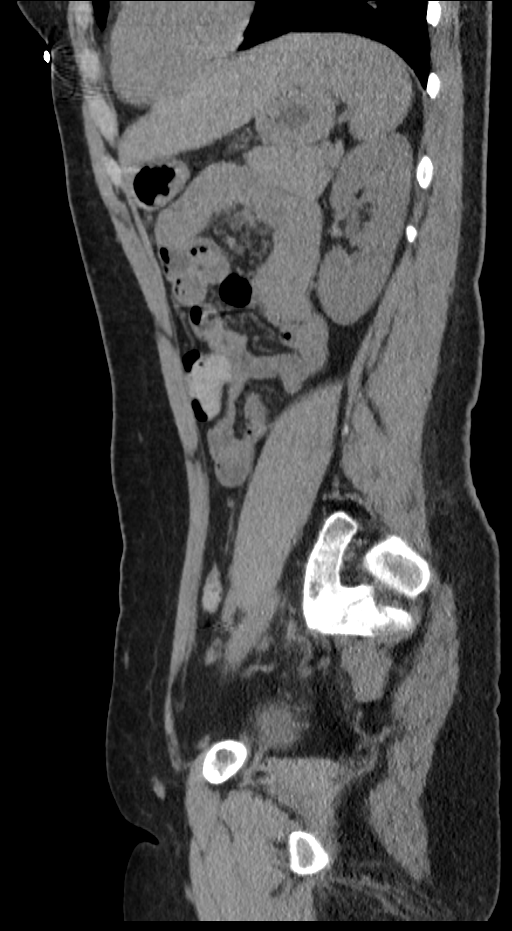
[im 50/150  bone]
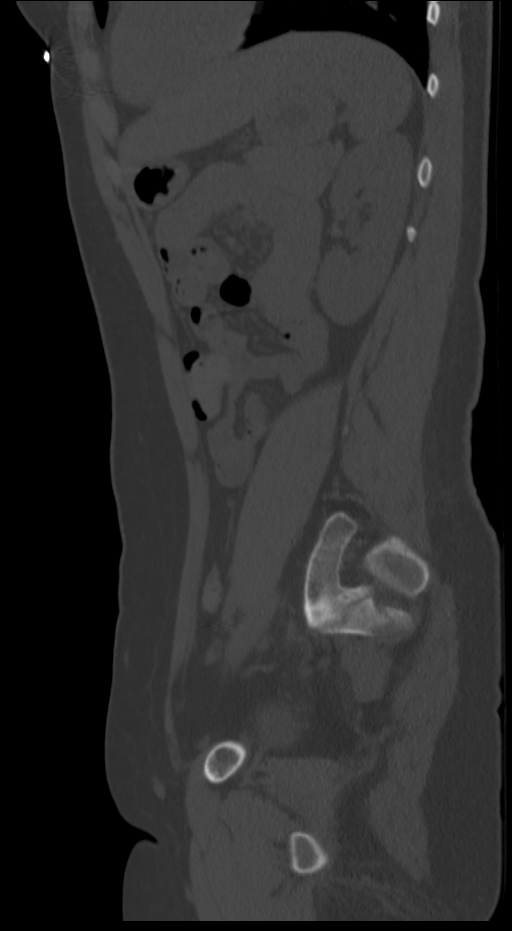

[7 of 46 positions shown; findings below may reference images not displayed]

FINDINGS: Lower chest: No acute abnormality.

Hepatobiliary: No focal liver abnormality is seen. No gallstones,
gallbladder wall thickening, or biliary dilatation.

Pancreas: Unremarkable. No pancreatic ductal dilatation or
surrounding inflammatory changes.

Spleen: Normal in size without focal abnormality.

Adrenals/Urinary Tract: Adrenal glands are within normal limits.
Kidneys are well visualized without definitive renal calculi. No
obstructive changes are seen. Bladder is well distended.

Stomach/Bowel: Appendix is within normal limits. No obstructive or
inflammatory changes of the colon are noted. Small bowel and stomach
are unremarkable.

Vascular/Lymphatic: No significant vascular findings are present. No
enlarged abdominal or pelvic lymph nodes.

Reproductive: Changes of tubal ligation are noted. The uterus is
otherwise within normal limits.

Other: No abdominal wall hernia or abnormality. No abdominopelvic
ascites.

Musculoskeletal: No acute or significant osseous findings.
IMPRESSION: No acute abnormality noted.

## 2021-10-30 DIAGNOSIS — E041 Nontoxic single thyroid nodule: Secondary | ICD-10-CM | POA: Insufficient documentation

## 2022-01-16 HISTORY — PX: THYROID SURGERY: SHX805

## 2024-01-19 ENCOUNTER — Ambulatory Visit: Admission: EM | Admit: 2024-01-19 | Discharge: 2024-01-19 | Disposition: A | Discharge status: Home / Self Care

## 2024-01-19 ENCOUNTER — Ambulatory Visit

## 2024-01-19 ENCOUNTER — Ambulatory Visit (INDEPENDENT_AMBULATORY_CARE_PROVIDER_SITE_OTHER)

## 2024-01-19 DIAGNOSIS — M79672 Pain in left foot: Secondary | ICD-10-CM

## 2024-01-19 DIAGNOSIS — S93402A Sprain of unspecified ligament of left ankle, initial encounter: Secondary | ICD-10-CM

## 2024-01-19 HISTORY — DX: Hypothyroidism, unspecified: E03.9

## 2024-01-19 MED ORDER — MELOXICAM 15 MG PO TABS: 15.0000 mg | ORAL_TABLET | Freq: Every day | ORAL | 0 refills | Status: DC | PRN

## 2024-01-19 NOTE — Discharge Instructions (Addendum)
 SPRAIN: Stressed avoiding painful activities . Reviewed RICE guidelines. Use medications as directed, including NSAIDs. If no NSAIDs have been prescribed for you today, you may take Aleve or Motrin over the counter. May use Tylenol in between doses of NSAIDs.  If no improvement in the next 1-2 weeks, f/u with PCP or return to our office for reexamination, and please feel free to call or return at any time for any questions or concerns you may have and we will be happy to help you!      I will contact you if radiologist sees any significant abnormalities on xray.  You have a condition requiring you to follow up with Orthopedics so please call one of the following office for appointment:   [facility] Address: [address], [place], [place] [address] Phone: [phone]  [facility] [address], [place], [place] [address] Phone: [phone]  [facility] [address] [place], [place] [address] Phone: [phone]

## 2024-01-19 NOTE — ED Triage Notes (Signed)
 Patient states that she tripped in a hole in her back yard about a week ago. Left foot is swollen and hurts. she is elevating and ice with no relief.

## 2024-01-19 NOTE — ED Provider Notes (Signed)
 [facility]    CSN: [id number] Arrival date & time: 01/19/24  1431      History   Chief Complaint Chief Complaint  Patient presents with   Foot Injury    HPI Joan Moreno is a 33 y.o. female presenting for approximately 1 week history of left ankle and heel pain.  Symptoms started after she stepped in a hole in her yard and fell.  She believes the swelling and discomfort of gotten worse.  She is able to walk and bear weight but has discomfort doing so.  No numbness or weakness.  She has been icing and elevating extremity and taking old prescription of meloxicam.  Reports that has been somewhat helpful.  Denies any other injuries.  HPI  Past Medical History:  Diagnosis Date   Hypothyroidism    Medical history non-contributory    Personal history of asthma    as a child    There are no active problems to display for this patient.   Past Surgical History:  Procedure Laterality Date   broke leg     put to sleep to put back in place   LAPAROSCOPIC TUBAL LIGATION Bilateral 08/15/2017   Procedure: LAPAROSCOPIC BILATERAL TUBAL LIGATION;  Surgeon: Hildred Laser, MD;  Location: [facility] ORS;  Service: Gynecology;  Laterality: Bilateral;   TONSILLECTOMY      OB History     Gravida  3   Para  2   Term  2   Preterm      AB  1   Living  2      SAB  1   IAB      Ectopic      Multiple  0   Live Births  2            Home Medications    Prior to Admission medications   Medication Sig Start Date End Date Taking? Authorizing Provider  levothyroxine (SYNTHROID) 25 MCG tablet Take 25 mcg by mouth daily.   Yes [provider]  meloxicam (MOBIC) 15 MG tablet Take 1 tablet (15 mg total) by mouth daily as needed for pain. 01/19/24  Yes Shirlee Latch, PA-C  acetaminophen (TYLENOL) 325 MG tablet Take 325-650 mg by mouth every 6 (six) hours as needed for moderate pain or headache.    [provider]  Multiple Vitamin (MULTIVITAMIN)  tablet Take 1 tablet by mouth daily.    [provider]  ondansetron (ZOFRAN ODT) 4 MG disintegrating tablet Allow 1-2 tablets to dissolve in your mouth every 8 hours as needed for nausea/vomiting 03/18/20   Loleta Rose, MD    Family History Family History  Problem Relation Age of Onset   Diabetes Mother        borderline   Diabetes Maternal Grandfather        aunt, uncle    Social History Social History   Tobacco Use   Smoking status: Former    Current packs/day: 0.00    Average packs/day: 0.5 packs/day for 0.5 years (0.3 ttl pk-yrs)    Types: Cigarettes    Start date: 04/06/2014    Quit date: 10/06/2014    Years since quitting: 9.2   Smokeless tobacco: Never  Vaping Use   Vaping status: Never Used  Substance Use Topics   Alcohol use: No    Alcohol/week: 0.0 standard drinks of alcohol   Drug use: No     Allergies   Stadol [butorphanol] and Sulfa antibiotics   Review  of Systems Review of Systems  Musculoskeletal:  Positive for arthralgias and joint swelling. Negative for gait problem.  Skin:  Negative for color change and wound.  Neurological:  Negative for weakness and numbness.     Physical Exam Triage Vital Signs ED Triage Vitals  Encounter Vitals Group     BP 01/19/24 1501 (!) 159/98     Systolic BP Percentile --      Diastolic BP Percentile --      Pulse Rate 01/19/24 1501 85     Resp 01/19/24 1501 15     Temp 01/19/24 1501 99.1 F (37.3 C)     Temp Source 01/19/24 1501 Oral     SpO2 01/19/24 1501 98 %     Weight --      Height --      Head Circumference --      Peak Flow --      Pain Score 01/19/24 1500 7     Pain Loc --      Pain Education --      Exclude from Growth Chart --    No data found.  Updated Vital Signs BP (!) 159/98 (BP Location: Left Arm)   Pulse 85   Temp 99.1 F (37.3 C) (Oral)   Resp 15   LMP 12/26/2023 (Approximate)   SpO2 98%      Physical Exam Vitals and nursing note reviewed.  Constitutional:       General: She is not in acute distress.    Appearance: Normal appearance. She is not ill-appearing or toxic-appearing.  HENT:     Head: Normocephalic and atraumatic.  Eyes:     General: No scleral icterus.       Right eye: No discharge.        Left eye: No discharge.     Conjunctiva/sclera: Conjunctivae normal.  Cardiovascular:     Rate and Rhythm: Normal rate.     Pulses: Normal pulses.  Pulmonary:     Effort: Pulmonary effort is normal. No respiratory distress.  Musculoskeletal:     Cervical back: Neck supple.     Left ankle: Swelling (lateral ankle) present. Tenderness present over the lateral malleolus and ATF ligament. Normal range of motion.     Left Achilles Tendon: Normal. No tenderness or defects.     Left foot: Bony tenderness (heel) present. No swelling. Normal pulse.  Skin:    General: Skin is dry.  Neurological:     General: No focal deficit present.     Mental Status: She is alert. Mental status is at baseline.     Motor: No weakness.     Gait: Gait abnormal.  Psychiatric:        Mood and Affect: Mood normal.        Behavior: Behavior normal.      UC Treatments / Results  Labs (all labs ordered are listed, but only abnormal results are displayed) Labs Reviewed - No data to display  EKG   Radiology No results found.  Procedures Procedures (including critical care time)  Medications Ordered in UC Medications - No data to display  Initial Impression / Assessment and Plan / UC Course  I have reviewed the triage vital signs and the nursing notes.  Pertinent labs & imaging results that were available during my care of the patient were reviewed by me and considered in my medical decision making (see chart for details).   33 year old female presents for left ankle and foot pain for the  past week.  Symptoms started after she fell in a hole at home.  Has been following RICE guidelines and taking meloxicam without any significant improvement.  X-ray of ankle  and foot obtained today.  Normal wet read.  Patient given Aircast ankle brace.  Reviewed RICE guidelines again.  Refilled meloxicam.  Advise she can also take Tylenol.  Reviewed avoiding painful activities.  Advised to go to [facility] if not improving over the next week or symptoms worsen.  Will contact her if the radiology overread differs significantly from my own.  Negative imaging.   Final Clinical Impressions(s) / UC Diagnoses   Final diagnoses:  Left foot pain  Sprain of left ankle, unspecified ligament, initial encounter     Discharge Instructions      SPRAIN: Stressed avoiding painful activities . Reviewed RICE guidelines. Use medications as directed, including NSAIDs. If no NSAIDs have been prescribed for you today, you may take Aleve or Motrin over the counter. May use Tylenol in between doses of NSAIDs.  If no improvement in the next 1-2 weeks, f/u with PCP or return to our office for reexamination, and please feel free to call or return at any time for any questions or concerns you may have and we will be happy to help you!      I will contact you if radiologist sees any significant abnormalities on xray.  You have a condition requiring you to follow up with Orthopedics so please call one of the following office for appointment:   [facility] Address: [address], [place], [place] [address] Phone: [phone]  [facility] [address] [place], [place] [address] Phone: [phone]  [facility] [address], [place], [place] [address] Phone: [phone]    ED Prescriptions     Medication Sig Dispense Auth. Provider   meloxicam (MOBIC) 15 MG tablet Take 1 tablet (15 mg total) by mouth daily as needed for pain. 30 tablet Gareth Morgan      PDMP not reviewed this encounter.   Shirlee Latch, PA-C 01/19/24 [phone]

## 2024-03-19 ENCOUNTER — Ambulatory Visit: Admitting: Physician Assistant

## 2024-05-04 ENCOUNTER — Ambulatory Visit: Admitting: Physician Assistant

## 2024-08-03 ENCOUNTER — Encounter: Payer: Self-pay | Admitting: Physician Assistant

## 2024-08-03 ENCOUNTER — Ambulatory Visit (INDEPENDENT_AMBULATORY_CARE_PROVIDER_SITE_OTHER): Admitting: Physician Assistant

## 2024-08-03 VITALS — BP 157/96 | HR 101 | Resp 16 | Ht 66.0 in | Wt 188.1 lb

## 2024-08-03 DIAGNOSIS — N921 Excessive and frequent menstruation with irregular cycle: Secondary | ICD-10-CM

## 2024-08-03 DIAGNOSIS — E66811 Obesity, class 1: Secondary | ICD-10-CM

## 2024-08-03 DIAGNOSIS — Z7689 Persons encountering health services in other specified circumstances: Secondary | ICD-10-CM | POA: Diagnosis not present

## 2024-08-03 DIAGNOSIS — M79671 Pain in right foot: Secondary | ICD-10-CM

## 2024-08-03 DIAGNOSIS — F419 Anxiety disorder, unspecified: Secondary | ICD-10-CM

## 2024-08-03 DIAGNOSIS — R5383 Other fatigue: Secondary | ICD-10-CM

## 2024-08-03 DIAGNOSIS — R030 Elevated blood-pressure reading, without diagnosis of hypertension: Secondary | ICD-10-CM | POA: Diagnosis not present

## 2024-08-03 DIAGNOSIS — E890 Postprocedural hypothyroidism: Secondary | ICD-10-CM

## 2024-08-03 MED ORDER — HYDROXYZINE HCL 10 MG PO TABS: 10.0000 mg | ORAL_TABLET | Freq: Three times a day (TID) | ORAL | 0 refills | Status: DC | PRN

## 2024-08-03 NOTE — Progress Notes (Signed)
 " Established patient visit  Patient: Joan Moreno   DOB: 1990/11/29   33 y.o. Female  MRN: [id number] Visit Date: 08/03/2024  Today's healthcare provider: Jolynn Spencer, PA-C   Chief Complaint  Patient presents with   New Patient (Initial Visit)    Last PCP: [facility] but they closed Last PAP Smear: has not had one done    Hypothyroidism    Medication refill on Levothyroxine .  Pt not fasting today   Subjective     Discussed the use of AI scribe software for clinical note transcription with the patient, who gave verbal consent to proceed.  History of Present Illness Joan Moreno is a 33 year old female with hypothyroidism who presents for new patient care and to restart levothyroxine .  She has not taken levothyroxine  for some time, previously on 25 mg. She underwent partial thyroidectomy for hyperthyroidism, resulting in hypothyroidism. She has gained approximately 40 pounds over the past few months despite a healthy diet and regular physical activity. She experiences fatigue and tiredness.  Anxiety symptoms include feeling hot, facial flushing, jitteriness, shakiness, and panic attacks. She uses hydroxyzine  for anxiety, which can cause shortness of breath and a rapid heartbeat. She denies depression.  Her menstrual cycle has been irregular, with no cycles since May until a recent heavy cycle with clots and increased cramping. She has not consulted a gynecologist.  She has plantar fasciitis, with morning tightness and swelling in her feet, managed with meloxicam  as needed. She has a history of high blood pressure and was on medication to slow her heart rate. She avoids caffeine  and primarily drinks water, with occasional alcohol consumption on weekends. She does not smoke or use recreational drugs. She lives with her two children and boyfriend of four years.       08/03/2024    2:50 PM  Depression screen PHQ 2/9  Decreased Interest 0  Down, Depressed, Hopeless 0  PHQ - 2  Score 0       No data to display           Medications: Outpatient Medications Prior to Visit  Medication Sig   levothyroxine  (SYNTHROID ) 25 MCG tablet Take 25 mcg by mouth daily.   [DISCONTINUED] acetaminophen  (TYLENOL ) 325 MG tablet Take 325-650 mg by mouth every 6 (six) hours as needed for moderate pain or headache.   [DISCONTINUED] meloxicam  (MOBIC ) 15 MG tablet Take 1 tablet (15 mg total) by mouth daily as needed for pain.   [DISCONTINUED] Multiple Vitamin (MULTIVITAMIN) tablet Take 1 tablet by mouth daily.   [DISCONTINUED] ondansetron  (ZOFRAN  ODT) 4 MG disintegrating tablet Allow 1-2 tablets to dissolve in your mouth every 8 hours as needed for nausea/vomiting   No facility-administered medications prior to visit.    Review of Systems All negative Except see HPI       Objective    BP (!) 157/96   Pulse (!) 101   Resp 16   Ht 5' 6 (1.676 m)   Wt 188 lb 1.6 oz (85.3 kg)   LMP 07/29/2024   SpO2 100%   Breastfeeding No   BMI 30.36 kg/m     Physical Exam Vitals reviewed.  Constitutional:      General: She is not in acute distress.    Appearance: Normal appearance. She is well-developed. She is obese. She is not diaphoretic.  HENT:     Head: Normocephalic and atraumatic.  Eyes:     General: No scleral icterus.    Conjunctiva/sclera: Conjunctivae normal.  Neck:     Thyroid : No thyromegaly.  Cardiovascular:     Rate and Rhythm: Normal rate and regular rhythm.     Pulses: Normal pulses.     Heart sounds: Normal heart sounds. No murmur heard. Pulmonary:     Effort: Pulmonary effort is normal. No respiratory distress.     Breath sounds: Normal breath sounds. No wheezing, rhonchi or rales.  Musculoskeletal:     Cervical back: Neck supple.     Right lower leg: No edema.     Left lower leg: No edema.  Lymphadenopathy:     Cervical: No cervical adenopathy.  Skin:    General: Skin is warm and dry.     Findings: No rash.  Neurological:     Mental Status:  She is alert and oriented to person, place, and time. Mental status is at baseline.  Psychiatric:        Mood and Affect: Mood normal.        Behavior: Behavior normal.      No results found for any visits on 08/03/24.      Assessment & Plan Abnormal uterine bleeding with heavy, irregular menses and menorrhagia Heavy, irregular menses with menorrhagia and clots. Possible anemia due to heavy bleeding. Family history of gynecological issues noted. - Refer to gynecology for evaluation and possible ultrasound. - Check hemoglobin levels for anemia.  Hypothyroidism status post right thyroid  lobectomy Hypothyroidism post-lobectomy with symptoms of fatigue and weight gain. Awaiting lab results before restarting levothyroxine . - Order thyroid  function tests. - Await lab results before prescribing levothyroxine .  Obesity with recent rapid weight gain Recent rapid weight gain possibly related to hypothyroidism. Reports healthy eating habits and regular physical activity. - Monitor weight and dietary habits. - Address weight gain in context of thyroid  management.  Elevated blood pressure Elevated blood pressure possibly exacerbated by caffeine  intake. No current antihypertensive medication. Lifestyle modifications discussed. - Advise monitoring blood pressure at home. - Encourage reduction of caffeine  intake. - Advise on lifestyle modifications: increase water intake, reduce salt, and exercise. - Return for blood pressure check in 2-3 weeks with records, sooner if BP stay high.  Anxiety disorder Chronic, unstable    08/03/2024    3:12 PM  GAD 7 : Generalized Anxiety Score  Nervous, Anxious, on Edge 3  Control/stop worrying 1  Worry too much - different things 1  Trouble relaxing 1  Restless 2  Easily annoyed or irritable 3  Afraid - awful might happen 0  Total GAD 7 Score 11  Anxiety Difficulty Somewhat difficult    Long-standing anxiety disorder with symptoms of jitteriness,  shakiness, and panic attacks. Prefers to restart medication. Counseling offered. - Prescribe hydroxyzine  at a low dose. - Offer counseling services. - Consider psychiatry referral if symptoms persist.  Right foot pain/Plantar fasciitis, recurrent Recurrent plantar fasciitis with severe pain, especially in the mornings. Previous treatment with meloxicam  was effective. - Refer to podiatry for evaluation and management.  Collaboration of Care: Medication Management AEB  , Primary Care Provider AEB  , Psychiatrist AEB  , and Referral or follow-up with counselor/therapist AEB    Patient/Guardian was advised Release of Information must be obtained prior to any record release in order to collaborate their care with an outside provider. Patient/Guardian was advised if they have not already done so to contact the registration department to sign all necessary forms in order for us  to release information regarding their care.   Consent: Patient/Guardian gives verbal consent for treatment and  assignment of benefits for services provided during this visit. Patient/Guardian expressed understanding and agreed to proceed.    Elevated blood pressure reading (Primary)  - TSH - Comprehensive metabolic panel with GFR - CBC with Differential/Platelet - Lipid Panel With LDL/HDL Ratio - Hepatitis C antibody - T4, free  Obesity (BMI 30.0-34.9)  - TSH - Comprehensive metabolic panel with GFR - CBC with Differential/Platelet - Lipid Panel With LDL/HDL Ratio - Hepatitis C antibody - T4, free  Encounter to establish care  - TSH - Comprehensive metabolic panel with GFR - CBC with Differential/Platelet - Lipid Panel With LDL/HDL Ratio - Hepatitis C antibody - T4, free  Menorrhagia with irregular cycle  - Comprehensive metabolic panel with GFR - CBC with Differential/Platelet - Ambulatory referral to Obstetrics / Gynecology   Other fatigue Chronic and stable Workup ordered - TSH - Comprehensive  metabolic panel with GFR - CBC with Differential/Platelet - Lipid Panel With LDL/HDL Ratio - Hepatitis C antibody - T4, free Will reassess after  receiving lab results Will FU   Anxiety  - hydrOXYzine  (ATARAX ) 10 MG tablet; Take 1 tablet (10 mg total) by mouth 3 (three) times daily as needed.  Dispense: 30 tablet; Refill: 0 - Amb ref to Integrated Behavioral Health   Right foot pain  - Ambulatory referral to Podiatry   Postoperative hypothyroidism -TSH  No orders of the defined types were placed in this encounter.   No follow-ups on file.   The patient was advised to call back or seek an in-person evaluation if the symptoms worsen or if the condition fails to improve as anticipated.  I discussed the assessment and treatment plan with the patient. The patient was provided an opportunity to ask questions and all were answered. The patient agreed with the plan and demonstrated an understanding of the instructions.  I, Shekela Goodridge, PA-C have reviewed all documentation for this visit. The documentation on 08/03/2024  for the exam, diagnosis, procedures, and orders are all accurate and complete.  Jolynn Spencer, [facility], MMS [facility] [phone] (phone) [phone] (fax)  [facility] "

## 2024-08-05 DIAGNOSIS — F419 Anxiety disorder, unspecified: Secondary | ICD-10-CM | POA: Insufficient documentation

## 2024-08-05 DIAGNOSIS — R030 Elevated blood-pressure reading, without diagnosis of hypertension: Secondary | ICD-10-CM | POA: Insufficient documentation

## 2024-08-05 DIAGNOSIS — R5383 Other fatigue: Secondary | ICD-10-CM | POA: Insufficient documentation

## 2024-08-05 DIAGNOSIS — E66811 Obesity, class 1: Secondary | ICD-10-CM | POA: Insufficient documentation

## 2024-08-05 DIAGNOSIS — N921 Excessive and frequent menstruation with irregular cycle: Secondary | ICD-10-CM | POA: Insufficient documentation

## 2024-08-05 DIAGNOSIS — M79671 Pain in right foot: Secondary | ICD-10-CM | POA: Insufficient documentation

## 2024-08-05 DIAGNOSIS — E890 Postprocedural hypothyroidism: Secondary | ICD-10-CM | POA: Insufficient documentation

## 2024-08-21 ENCOUNTER — Ambulatory Visit (INDEPENDENT_AMBULATORY_CARE_PROVIDER_SITE_OTHER)

## 2024-08-21 ENCOUNTER — Encounter: Payer: Self-pay | Admitting: Podiatry

## 2024-08-21 ENCOUNTER — Ambulatory Visit (INDEPENDENT_AMBULATORY_CARE_PROVIDER_SITE_OTHER): Admitting: Podiatry

## 2024-08-21 VITALS — Ht 66.0 in | Wt 188.1 lb

## 2024-08-21 DIAGNOSIS — M722 Plantar fascial fibromatosis: Secondary | ICD-10-CM

## 2024-08-21 DIAGNOSIS — M216X1 Other acquired deformities of right foot: Secondary | ICD-10-CM | POA: Diagnosis not present

## 2024-08-21 DIAGNOSIS — M216X2 Other acquired deformities of left foot: Secondary | ICD-10-CM | POA: Diagnosis not present

## 2024-08-21 MED ORDER — MELOXICAM 15 MG PO TABS: 15.0000 mg | ORAL_TABLET | Freq: Every day | ORAL | 1 refills | Status: DC

## 2024-08-21 MED ORDER — METHYLPREDNISOLONE 4 MG PO TBPK: ORAL_TABLET | ORAL | 0 refills | Status: DC

## 2024-08-21 NOTE — Progress Notes (Signed)
 Chief Complaint  Patient presents with   Foot Pain    Over the summer she stepped into a mole hole. Since then she has been having pain in the right foot arch area. She went to urgent care and got xrays, a cam boot and a RX. Pain scale is 9/10 at the worst.     HPI: 33 y.o. female presenting today for evaluation of pain and tenderness associated to the right foot ongoing for several months.  Initially started when she stepped in a hole late spring 2025 with bilateral foot pain.  She says that her left foot was x-rayed at the time of injury at the urgent care and it has subsequently improved and healed 100%.  She continues to have chronic pain however to the right foot  Past Medical History:  Diagnosis Date   Anxiety    Asthma    Hypothyroidism    Medical history non-contributory    Personal history of asthma    as a child    Past Surgical History:  Procedure Laterality Date   broke leg     put to sleep to put back in place   LAPAROSCOPIC TUBAL LIGATION Bilateral 08/15/2017   Procedure: LAPAROSCOPIC BILATERAL TUBAL LIGATION;  Surgeon: Connell Davies, MD;  Location: [facility] ORS;  Service: Gynecology;  Laterality: Bilateral;   THYROID  SURGERY Right 01/2022   TONSILLECTOMY      Allergies  Allergen Reactions   Caffeine      Shakey, jitters   Stadol [Butorphanol] Other (See Comments)    Pt felt like she couldn't catch her breathe and felt like she was having a panic attack   Hydrocodone Nausea And Vomiting   Sulfa Antibiotics Hives and Rash     Physical Exam: General: The patient is alert and oriented x3 in no acute distress.  Dermatology: Skin is warm, dry and supple bilateral lower extremities.   Vascular: Palpable pedal pulses bilaterally. Capillary refill within normal limits.  No appreciable edema.  No erythema.  Neurological: Grossly intact via light touch  Musculoskeletal Exam: Generalized metatarsalgia noted throughout the right forefoot with pain along the medial  longitudinal arch of the foot consistent with a plantar fasciitis  Radiographic Exam RT foot 08/21/2024:  Normal osseous mineralization. Joint spaces preserved.  No fractures or osseous irregularities noted.  Impression: Negative  Assessment/Plan of Care: 1.  History of severe ankle/foot sprain left. DOI: end of March 2025 2.  Metatarsalgia right foot 3.  Arch pain/plantar fasciitis right foot  -Patient evaluated.  X-rays reviewed -Prescription for Medrol Dosepak -Prescription for meloxicam  15 mg daily after completion of the Dosepak -Advised against going barefoot.  She does admit to walking around the house barefoot the majority of the day.  Refrain from this.  Recommended OOFOS recovery slides -I also believe the patient would benefit from custom molded orthotics to support the medial longitudinal arch of the foot and offload pressure from the forefoot.  The patient was scanned today for custom molded insoles -Return to clinic for orthotics pickup and dispensable  *stay at home mother    Thresa EMERSON Sar, DPM [facility]  Dr. Thresa EMERSON Sar, DPM    [address], [place] [address]  Office [phone]  Fax [phone]

## 2024-08-23 ENCOUNTER — Ambulatory Visit: Admitting: Physician Assistant

## 2024-09-03 ENCOUNTER — Ambulatory Visit: Admitting: Physician Assistant

## 2024-09-10 ENCOUNTER — Ambulatory Visit (INDEPENDENT_AMBULATORY_CARE_PROVIDER_SITE_OTHER): Admitting: Licensed Clinical Social Worker

## 2024-09-10 DIAGNOSIS — F320 Major depressive disorder, single episode, mild: Secondary | ICD-10-CM

## 2024-09-10 DIAGNOSIS — F411 Generalized anxiety disorder: Secondary | ICD-10-CM

## 2024-09-10 NOTE — Patient Instructions (Signed)
 Using Behavioral Activation to manage stress/depression symptoms     Identify/understand your own mood triggers.   Structure your day--get up around the same time, eat meals/snacks around the same time, go to bed around the same time.   Purposefully schedule self care time and time to complete tasks. This can include quiet time.  Stimulate your brain--go for a walk, text/call a friend or family member, if you are indoors--go outside (and vice versa), go for a drive, go to a store with bright colors and bright lights. Try to do things in a different way--drive to your favorite places using an alternative route, or instead of starting on the right side of the grocery store when shopping, start on the left side. You might feel a bit uncomfortable doing things outside of the comfort zone, but this is helping the brain create new neural pathways and is very healthy for brain/emotional health.   Physical movement based on your ability. If you can go for a walk, do stretches, even waving your hands to music can trigger feel-good endorphins in the brain and help release physical tension we all hold in our bodies.  Even 5 minutes can make a difference.   Be intentional about doing things that bring you joy (or used to bring you joy), and look for the things in every day that make you happy.  Seek those glimmers of joy each day.  Set a timer for 5 minutes for a harder task (ex. Laundry, washing dishes).  Allow yourself to work distraction-free for 5 minutes, then stop when the timer goes off. If you need a break, take a break. If you want to continue working then set another timer for whatever time you choose.   Limit or eliminate substance use including alcohol, marijuana, or recreational use of prescription medication.  Let in the light!! Open the window blinds, curtains and let natural light in. Even sitting near a window or sitting outside can boost your mood, especially in the wintertime when  there is less daylight.   If you take medication to manage symptoms, remember to take all medications as they are prescribed (please read all labels!!)    Things to envision for ourselves to to improve inspiration, motivation, and initiative :  improving physical wellness, focus on family relationships, focusing on our own mental/emotional well being, being a part of a bigger community, finding a hobby, being a part of something that fosters personal growth, engaging socially with others (even digitally!!!)    ANXIETY/PANIC EPISODE MANAGEMENT (CBT/MINDFULNESS BASED)   If you are in a highly stimulating or triggering environment, change your location to a less stimulating or safer environment .   Stimulate your senses by tasting/eating a sour candy (such as a lemon drop) or a strong flavored cough drop.  This triggers smell, taste, and touch since we  have had lots of nerve endings inside of your mouth.   Practice slow, controlled breathing to avoid hyperventilation.  Breathing into your nose for the count of 4 inside your head, holding your breath for the count of 4 inside your head, and exhale slowly counting to 8 inside your head.  This is called 4-4-8 breathing, or triangle breathing. (Controlled breathing). This helps manage panic, anxiety, anger, and tearfulness.  Additional grounding exercises include rubbing your hands softly together, or wiggling toes inside your shoes, pretending to grasp the floor with your toes.    Listen to calming music or sounds  Count backwards in your head by  twos or tens or recite multiplication tables in your head.  Visualize a calming, happy place and identify 5 explicit details about this place (color, temperature, smells, visual details, etc.)  Splash cold water on your face or hold an ice cube in your hand (alternate hands)  Clench and release muscle groups (hands, shoulders, facial muscles)  Engage in soothing activities to recover after a  stressful/anxious episode such as: drinking a warm beverage, sitting in your favorite comfortable location, positive physical contact with a pet or weighted blanket, using positive self talk/positive affirmations.   Download PTSD Coach app for your phone or tablet--its free and has lots of tips to help you manage panic episodes no matter where you are!    Emergency Resources:  National Suicide & Crisis Lifeline: Call or text 988  Crisis Text Line: Text HOME to 332-080-6994  [facility]  [address] [place], [place] [address] [phone] or [phone] [facility] 24/7 FOR ANYONE [address], [place], [place]  [phone] Fax: [phone] [email or web address] *Interpreters available *Accepts all insurance and uninsured for Urgent Care needs *Accepts Medicaid and uninsured for outpatient treatment (below)

## 2024-09-10 NOTE — BH Specialist Note (Signed)
 Collaborative Care Initial Assessment    Pt name: Joan Moreno MRN# [id number]   Date: 09/10/24   Session Start time 1300 Session End time: 1335 Total time in minutes: 35  Encounter Diagnoses  Name Primary?   Generalized anxiety disorder Yes   Depression, major, single episode, mild     Type of Contact:  IN PERSON   Patient consent obtained:  Yes  Patient and/or legal guardian verbally consented to [facility] services about presenting concerns and psychiatric consultation as appropriate.  The services will be billed as appropriate for the patient   Types of Service: Collaborative care and Health & Behavioral Assessment/Intervention  Summary  Joan Moreno is a 33 y.o. female with history of a anxiety.en in consultation at the request of Janna Ostwalt PA C for establishment of [facility] Collaborative Care management.   Pt is currently taking the following psychiatric medications: hydroxyzine .  Current symptoms include: feeling nervous/anxious/on edge, worrying too much about different things, increased irritability, fearful as if something bad might happen, little interest or pleasure in doing things, feeling tired or having little energy, poor appetite, trouble focusing and concentrating on things, feeling fidgety and restless.  Patient reports that she has panic attacks daily.  Patient reports when she has a panic attack she feels like her heart is racing, and a generalized feeling of  freaking out.  pt denies SI, HI, or AVH at time of session. Pt denies substance use.   Reason for referral in patient/family's own words:   Getting extra help managing my anxiety  Patient's goal for today's visit: Establish [facility] Collaborative Care  History of Present illness:    History of present illness:  Joan Moreno reports that they have a history of anxiety for most of her adult life and have had the following treatments: Hydroxyzine and Xanax.  Pt reports  concerns about medical history including history of thyroid  issues including a partial thyroidectomy.  Pt reports that current external stressors include parenting her children.  Pt feels that symptoms of stress, depression, and anxiety are impacting everyday functioning including sleep quality/quantity, social interaction, ability to engage in recreational activities, impacting appetite, and ability to engage with tasks both inside and outside of the home. Joan Moreno reports that her boyfriend is primary support system at time of assessment.   Pt feels [facility] collaborative care supports and potential referral for ADHD testing would be something to assist in their overall symptom management.   Clinical Assessments (PHQ-9 and GAD-7)  PHQ-9 Assessments:     09/10/2024    1:33 PM 08/03/2024    3:12 PM 08/03/2024    2:50 PM  Depression screen PHQ 2/9  Decreased Interest 1 0 0  Down, Depressed, Hopeless 1 0 0  PHQ - 2 Score 2 0 0  Altered sleeping 3 0   Tired, decreased energy 2 1   Change in appetite 2 0   Feeling bad or failure about yourself  0 0   Trouble concentrating 3 1   Moving slowly or fidgety/restless 3 0   Suicidal thoughts 0 0   PHQ-9 Score 15 2    Difficult doing work/chores Very difficult Somewhat difficult      Data saved with a previous flowsheet row definition     GAD-7 Assessments:     09/10/2024    1:34 PM 08/03/2024    3:12 PM  GAD 7 : Generalized Anxiety Score  Nervous, Anxious, on Edge 3 3  Control/stop worrying 2 1  Worry too much - different things 3 1  Trouble relaxing 3 1  Restless 3 2  Easily annoyed or irritable 3 3  Afraid - awful might happen 1 0  Total GAD 7 Score 18 11  Anxiety Difficulty Very difficult Somewhat difficult      Social History:  Household:  resides with boyfriend and two children Marital status:  unmarried Number of Children:  2 (ages 12 and 54) 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  Education:   high school graduation--thinking about going back to finish GED.    Psychiatric Review of systems: Insomnia: some nights awake until 5am. 1-2 times per week.  Changes in appetite: none--skips meals sometimes Decreased need for sleep: No Family history of bipolar disorder: No  everyone on my dads side of the family have ADD/ADHD.  One cousin with bipolar disorder  Hallucinations: No   Paranoia: No    Psychotropic medications: hydroxyzine 10 mg  Current medications: Current Outpatient Medications on File Prior to Visit  Medication Sig Dispense Refill   hydrOXYzine (ATARAX) 10 MG tablet Take 1 tablet (10 mg total) by mouth 3 (three) times daily as needed. 30 tablet 0   levothyroxine (SYNTHROID) 25 MCG tablet Take 25 mcg by mouth daily.     meloxicam  (MOBIC ) 15 MG tablet Take 1 tablet (15 mg total) by mouth daily. 60 tablet 1   methylPREDNISolone  (MEDROL  DOSEPAK) 4 MG TBPK tablet 6 day dose pack - take as directed 21 tablet 0   No current facility-administered medications on file prior to visit.     Patient taking medications as prescribed:  Yes Side effects reported: No   Psychiatric History  Have you ever been treated for a mental health problem? Yes If Yes, when were you treated and whom did you see (psychiatrist/counselor) ?     When several years ago       Name of provider Primary Care provider:  pt reports that her previous PCP wanted to send her for neuropsychological testing for ADHD. Pt reports that this provider also prescribed her xanax to take prn    Psychiatric History  Depression: No Anxiety: Yes--picks at nails, restlessness Mania: Yes--impulsive at times, reckless spending Psychosis: No PTSD symptoms: No  Past Psychiatric History/Hospitalization(s): Hospitalization for psychiatric illness: No Prior Suicide Attempts: No Prior Self-injurious behavior: No  Have you ever had thoughts of harming yourself or others or attempted suicide? No plan to harm self or  others  Traumatic Experiences: History or current traumatic events (natural disaster, house fire, etc.)? no History or current physical trauma?  no History or current emotional trauma?  no History or current sexual trauma?  no History or current domestic or intimate partner violence?  no   Alcohol and/or Substance Use History   Tobacco Alcohol Other substances  Current use Pt denies (AUDIT-C screening) Pt denies Pt denies  Past use Cigarettes quit in 2015 Social drinking in past  Pt denies  Past treatment Pt denies Pt denies Pt denies   [employer], [employer] from 09/03/2024 in [facility]  AUDIT-C Score 5      Withdrawal Potential: none  [place] Suicide Severity Rating Scale:  Flowsheet Row Integrated Behavioral Health from 09/10/2024 in [facility] UC from 01/19/2024 in [facility]   C-SSRS RISK CATEGORY No Risk No Risk     Guns in the home (secured):  no   The  patient demonstrates the following risk factors for suicide: Chronic risk factors for suicide include: N/A. Acute risk factors for suicide include: N/A. Protective factors for this patient include: coping skills and hope for the future. Considering these factors, the overall suicide risk at this point appears to be low. Patient is appropriate for outpatient follow up.  Danger to Others Risk Assessment Danger to others risk factors:  NONE Patient endorses recent thoughts of harming others:  Pt denies Dynamic Appraisal of Situational Aggression (DASA): NONE  BH Counselor discussed emergency crisis plan with client and provided local emergency services resources.  Mental status exam:   General Appearance Siegfried:  Neat Eye Contact:  Good Motor Behavior:  Restlestness Speech:  Normal Level of Consciousness:  Alert Mood:  Anxious Affect:  Appropriate Anxiety Level:  Minimal Thought Process:  Coherent Thought Content:  WNL Perception:   Normal Judgment:  Good Insight:  Present  Diagnosis: Encounter Diagnoses  Name Primary?   Generalized anxiety disorder Yes   Depression, major, single episode, mild       Goals: Increase healthy adjustment to current life circumstances   Interventions: Mindfulness or Relaxation Training, Behavioral Activation, and Supportive Counseling   Follow-up Plan: [facility] Collaborative Care team and potential referral to neuropsychological testing for ADHD  Jaedah Lords R Maitland Muhlbauer, LCSW  Assessment completed by Tawni Brisker, MSW, LCSW  on 09/10/24

## 2024-09-11 ENCOUNTER — Ambulatory Visit (HOSPITAL_COMMUNITY): Payer: Self-pay

## 2024-09-11 ENCOUNTER — Ambulatory Visit
Admission: EM | Admit: 2024-09-11 | Discharge: 2024-09-11 | Disposition: A | Discharge status: Home / Self Care | Attending: Emergency Medicine | Admitting: Emergency Medicine

## 2024-09-11 ENCOUNTER — Ambulatory Visit (INDEPENDENT_AMBULATORY_CARE_PROVIDER_SITE_OTHER)

## 2024-09-11 DIAGNOSIS — M898X1 Other specified disorders of bone, shoulder: Secondary | ICD-10-CM | POA: Diagnosis not present

## 2024-09-11 DIAGNOSIS — S4981XA Other specified injuries of right shoulder and upper arm, initial encounter: Secondary | ICD-10-CM | POA: Diagnosis not present

## 2024-09-11 DIAGNOSIS — S20211A Contusion of right front wall of thorax, initial encounter: Secondary | ICD-10-CM | POA: Diagnosis not present

## 2024-09-11 MED ORDER — METHYLPREDNISOLONE 4 MG PO TBPK: ORAL_TABLET | ORAL | 0 refills | Status: DC

## 2024-09-11 MED ORDER — TRAMADOL HCL 50 MG PO TABS: 50.0000 mg | ORAL_TABLET | Freq: Four times a day (QID) | ORAL | 0 refills | Status: DC | PRN

## 2024-09-11 MED ORDER — BACLOFEN 10 MG PO TABS: 10.0000 mg | ORAL_TABLET | Freq: Three times a day (TID) | ORAL | 0 refills | Status: DC

## 2024-09-11 NOTE — ED Provider Notes (Addendum)
 [facility]    CSN: [id number] Arrival date & time: 09/11/24  0815      History   Chief Complaint Chief Complaint  Patient presents with   Arm Pain    HPI Joan Moreno is a 33 y.o. female.   HPI  33 year old female with past medical history significant for elevated blood pressure reading, anxiety, postoperative hypothyroidism presents for evaluation of pain in her right clavicle.  She reports that she tripped over her dog and attempted to catch herself with her right hand but lined up impacting the wall with her clavicle.  She is complaining of pain in her collarbone and in her right anterior chest wall.  No shortness of breath.  Pain does increase with movement of her right arm.  Past Medical History:  Diagnosis Date   Anxiety    Asthma    Hypothyroidism    Medical history non-contributory    Personal history of asthma    as a child    Patient Active Problem List   Diagnosis Date Noted   Menorrhagia with irregular cycle 08/05/2024   Obesity (BMI 30.0-34.9) 08/05/2024   Elevated blood pressure reading 08/05/2024   Anxiety 08/05/2024   Other fatigue 08/05/2024   Right foot pain 08/05/2024   Postoperative hypothyroidism 08/05/2024   Thyroid  nodule 10/30/2021    Past Surgical History:  Procedure Laterality Date   broke leg     put to sleep to put back in place   LAPAROSCOPIC TUBAL LIGATION Bilateral 08/15/2017   Procedure: LAPAROSCOPIC BILATERAL TUBAL LIGATION;  Surgeon: Connell Davies, MD;  Location: [facility] ORS;  Service: Gynecology;  Laterality: Bilateral;   THYROID  SURGERY Right 01/2022   TONSILLECTOMY      OB History     Gravida  3   Para  2   Term  2   Preterm      AB  1   Living  2      SAB  1   IAB      Ectopic      Multiple  0   Live Births  2            Home Medications    Prior to Admission medications   Medication Sig Start Date End Date Taking? Authorizing Provider  baclofen  (LIORESAL ) 10 MG tablet Take  1 tablet (10 mg total) by mouth 3 (three) times daily. 09/11/24  Yes Bernardino Ditch, NP  traMADol  (ULTRAM ) 50 MG tablet Take 1 tablet (50 mg total) by mouth every 6 (six) hours as needed for severe pain (pain score 7-10). 09/11/24  Yes Bernardino Ditch, NP  hydrOXYzine (ATARAX) 10 MG tablet Take 1 tablet (10 mg total) by mouth 3 (three) times daily as needed. 08/03/24   Ostwalt, Janna, PA-C  levothyroxine (SYNTHROID) 25 MCG tablet Take 25 mcg by mouth daily.    [provider]  meloxicam  (MOBIC ) 15 MG tablet Take 1 tablet (15 mg total) by mouth daily. 08/21/24 12/19/24  Janit Thresa HERO, DPM  methylPREDNISolone  (MEDROL  DOSEPAK) 4 MG TBPK tablet 6 day dose pack - take as directed 09/11/24   Bernardino Ditch, NP    Family History Family History  Problem Relation Age of Onset   Diabetes Mother        borderline   Diabetes Maternal Grandfather        aunt, uncle    Social History Social History   Tobacco Use   Smoking status: Former    Current packs/day: 0.00  Average packs/day: 0.5 packs/day for 0.5 years (0.3 ttl pk-yrs)    Types: Cigarettes    Start date: 04/06/2014    Quit date: 10/06/2014    Years since quitting: 9.9   Smokeless tobacco: Never  Vaping Use   Vaping status: Never Used  Substance Use Topics   Alcohol use: No    Alcohol/week: 0.0 standard drinks of alcohol   Drug use: No     Allergies   Caffeine , Stadol [butorphanol], Hydrocodone, and Sulfa antibiotics   Review of Systems Review of Systems  Respiratory:  Negative for shortness of breath.   Cardiovascular:  Positive for chest pain.       Right clavicle and right anterior chest wall pain.  Skin:  Negative for color change.     Physical Exam Triage Vital Signs ED Triage Vitals  Encounter Vitals Group     BP 09/11/24 0831 (!) 159/105     Girls Systolic BP Percentile --      Girls Diastolic BP Percentile --      Boys Systolic BP Percentile --      Boys Diastolic BP Percentile --      Pulse Rate 09/11/24  0831 78     Resp 09/11/24 0831 16     Temp 09/11/24 0831 98.6 F (37 C)     Temp Source 09/11/24 0831 Oral     SpO2 09/11/24 0831 98 %     Weight 09/11/24 0828 185 lb 4.8 oz (84.1 kg)     Height --      Head Circumference --      Peak Flow --      Pain Score 09/11/24 0830 6     Pain Loc --      Pain Education --      Exclude from Growth Chart --    No data found.  Updated Vital Signs BP (!) 159/105 (BP Location: Left Arm)   Pulse 78   Temp 98.6 F (37 C) (Oral)   Resp 16   Wt 185 lb 4.8 oz (84.1 kg)   LMP 07/18/2024 (Approximate)   SpO2 98%   BMI 29.91 kg/m   Visual Acuity Right Eye Distance:   Left Eye Distance:   Bilateral Distance:    Right Eye Near:   Left Eye Near:    Bilateral Near:     Physical Exam Vitals and nursing note reviewed.  Constitutional:      Appearance: Normal appearance. She is not ill-appearing.  HENT:     Head: Normocephalic and atraumatic.  Cardiovascular:     Rate and Rhythm: Normal rate and regular rhythm.     Pulses: Normal pulses.     Heart sounds: Normal heart sounds. No murmur heard.    No friction rub. No gallop.  Pulmonary:     Effort: Pulmonary effort is normal.     Breath sounds: Normal breath sounds. No wheezing, rhonchi or rales.     Comments: Tenderness with palpation of the midshaft of the right clavicle as well as the superior aspect of the right anterior chest wall.  No crepitus appreciated. Chest:     Chest wall: Tenderness present.  Musculoskeletal:        General: Tenderness and signs of injury present. No swelling.  Skin:    General: Skin is warm and dry.     Capillary Refill: Capillary refill takes less than 2 seconds.     Findings: No bruising or erythema.  Neurological:     General: No  focal deficit present.     Mental Status: She is alert and oriented to person, place, and time.      UC Treatments / Results  Labs (all labs ordered are listed, but only abnormal results are displayed) Labs Reviewed -  No data to display  EKG   Radiology No results found.  Procedures Procedures (including critical care time)  Medications Ordered in UC Medications - No data to display  Initial Impression / Assessment and Plan / UC Course  I have reviewed the triage vital signs and the nursing notes.  Pertinent labs & imaging results that were available during my care of the patient were reviewed by me and considered in my medical decision making (see chart for details).   Patient is a pleasant, nontoxic-appearing 33 year old female presenting for evaluation of blunt force trauma to her right clavicle and right anterior chest wall that happened 3 to 4 days ago.  She is able to speak in full sentences without dyspnea or tachypnea.  She has no cough or shortness of breath.  She is reporting pain in her collarbone that does increase with respiration as well as with movement of her right arm.  She reports the pain radiates down her right arm and is radiating up into her neck.  She does have significant tenderness and spasm to the right trapezius muscle as well as tenderness to the midshaft of the right clavicle without crepitus.  Anterior chest wall is also tender without crepitus.  Her lungs are clear to auscultation all fields.  No overlying edema, erythema, or ecchymosis noted.  I will obtain radiograph of the right clavicle to evaluate for any bony injury.  Right clavicle x-rays independent reviewed and evaluated by me.  Impression: No evidence of fracture.  Visible portion of right ribs also do not reveal any evidence of fracture or pneumothorax.  Radiology read is pending. Radiology impression states no acute osseous abnormality.  I will discharge patient on the diagnosis of right clavicle contusion and contusion of right anterior chest wall.  I will prescribe a Medrol  Dosepak as she is already taking meloxicam  daily along with 10 mg of baclofen  to help with muscle spasm in her right shoulder.  I will give  her home physical therapy exercises as well.  She may apply a topical Salonpas or over-the-counter topical lidocaine  patches every 8 hours as needed.  Additionally, I will give her a short prescription of tramadol  that she can use as needed for more severe pain or at nighttime.  She has no narcotic scripts listed in PDMP.   Final Clinical Impressions(s) / UC Diagnoses   Final diagnoses:  Pain of right clavicle  Contusion of right clavicle, initial encounter  Chest wall contusion, right, initial encounter     Discharge Instructions      Start taking the Medrol  Dosepak as directed for the pain in your right chest wall and right shoulder.  Hold your meloxicam  while you are taking the steroids.  Take the 10 mg of baclofen  every 8 hours to help with muscle spasm in your neck and shoulder.  You may apply ice to your chest wall for 20 minutes at a time, 2-3 times a day, to help with pain and swelling.  You may also apply topical Salonpas or over-the-counter lidocaine  patches directly to the area of pain.  Each patch is good for 8 hours.  I am going to prescribe you some tramadol  that you can use as needed for more severe pain  or at bedtime.  Be mindful that this medication will sedate you so do not drink alcohol or drive if you take it.  Also do not operate heavy machinery or sign any legal documents.  If you continue to have pain, or your pain worsens, please return for reevaluation or follow-up with your primary care provider.     ED Prescriptions     Medication Sig Dispense Auth. Provider   methylPREDNISolone  (MEDROL  DOSEPAK) 4 MG TBPK tablet 6 day dose pack - take as directed 21 tablet Bernardino Ditch, NP   baclofen  (LIORESAL ) 10 MG tablet Take 1 tablet (10 mg total) by mouth 3 (three) times daily. 30 each Bernardino Ditch, NP   traMADol  (ULTRAM ) 50 MG tablet Take 1 tablet (50 mg total) by mouth every 6 (six) hours as needed for severe pain (pain score 7-10). 10 tablet Bernardino Ditch, NP       I have reviewed the PDMP during this encounter.   Bernardino Ditch, NP 09/11/24 0920    Bernardino Ditch, NP 09/11/24 1002

## 2024-09-11 NOTE — Discharge Instructions (Addendum)
 Start taking the Medrol  Dosepak as directed for the pain in your right chest wall and right shoulder.  Hold your meloxicam  while you are taking the steroids.  Take the 10 mg of baclofen  every 8 hours to help with muscle spasm in your neck and shoulder.  You may apply ice to your chest wall for 20 minutes at a time, 2-3 times a day, to help with pain and swelling.  You may also apply topical Salonpas or over-the-counter lidocaine  patches directly to the area of pain.  Each patch is good for 8 hours.  I am going to prescribe you some tramadol  that you can use as needed for more severe pain or at bedtime.  Be mindful that this medication will sedate you so do not drink alcohol or drive if you take it.  Also do not operate heavy machinery or sign any legal documents.  If you continue to have pain, or your pain worsens, please return for reevaluation or follow-up with your primary care provider.

## 2024-09-11 NOTE — ED Triage Notes (Signed)
 Pt c/o R arm pain x3 days. States tripped over her dog & tried to catch her self but missed & hit herself against the wall. Pain worse with movements. Has tried OTC meds w/o relief.

## 2024-09-12 ENCOUNTER — Telehealth (INDEPENDENT_AMBULATORY_CARE_PROVIDER_SITE_OTHER): Payer: Self-pay | Admitting: Licensed Clinical Social Worker

## 2024-09-12 DIAGNOSIS — F411 Generalized anxiety disorder: Secondary | ICD-10-CM

## 2024-09-12 DIAGNOSIS — F331 Major depressive disorder, recurrent, moderate: Secondary | ICD-10-CM

## 2024-09-12 NOTE — BH Specialist Note (Signed)
 Attestation signed by Jacquetta Sharlot GRADE, NP at 09/12/2024 11:22 AM   Attestation signed by Warren Jacquetta, PMHNP, DNP 09/12/2024 11:19 AM   Collaborative Care Psychiatric Consultant Case Review   Assessment/Provisional Diagnosis 33 year old female with history of hypothyroidism, menorrhagia, and obesity. The patient is referred for anxiety and depression.   Provisional Diagnosis: # MDD, Recurrent, moderately severe # GAD    Recommendation 1. Recommend Lexapro 10 mg daily 2. Discontinue hydroxyzine 10 mg TID PRN, no longer effective 3. Recommend gabapentin 100 mg BID PRN anxiety 4. Labs and vitamin D levels needed 5. BH specialist to follow up.   Thank you for your consult. Please contact our collaborative care team for any questions or concerns.   I spent 20 minutes chart reviewing, discussing with [facility] Speicalist and documenting in the chart.   The above treatment considerations and suggestions are based on consultation with the [facility] specialist and/or PCP and a review of information available in the shared registry and the patient's Electronic Health Record (EHR). I have not personally examined the patient. All recommendations should be implemented with consideration of the patient's relevant prior history and current clinical status. Please feel free to call me with any questions about the care of this patient.   Virtual Behavioral Health Treatment Plan Team Note  MRN: [id number] NAME: Joan Moreno  DATE: 09/12/24  Start time: Start Time: 1100 End time: Stop Time: 1120 Total time: Total Time in Minutes (Visit): 20  Total number of Virtual BH Treatment Team Plan encounters: 1/4  Treatment Team Attendees: Tawni Brisker, LCSW , Prentice Espy, MD, and Sharlot Jacquetta, DNP   Diagnoses:    ICD-10-CM   1. MDD (major depressive disorder), recurrent episode, moderate (HCC)  F33.1     2. Generalized anxiety disorder  F41.1       Goals, Interventions and Follow-up Plan Goals: Increase  healthy adjustment to current life circumstances Interventions: Mindfulness or Relaxation Training Behavioral Activation Supportive Counseling  Medication Management Recommendations:  1. Recommend Lexapro 10 mg daily 2. Discontinue hydroxyzine 10 mg TID PRN, no longer effective 3. Recommend gabapentin 100 mg BID PRN anxiety  Follow-up Plan: [facility] Collaborative Care team and potential referral to neuropsychological testing for ADHD  History of the present illness Presenting Problem/Current Symptoms: Joan Moreno is a 33 y.o. female with history of a anxiety.en in consultation at the request of Janna Ostwalt PA C for establishment of [facility] Collaborative Care management.   Pt is currently taking the following psychiatric medications: hydroxyzine .  Current symptoms include: feeling nervous/anxious/on edge, worrying too much about different things, increased irritability, fearful as if something bad might happen, little interest or pleasure in doing things, feeling tired or having little energy, poor appetite, trouble focusing and concentrating on things, feeling fidgety and restless.  Patient reports that she has panic attacks daily.  Patient reports when she has a panic attack she feels like her heart is racing, and a generalized feeling of  freaking out.  pt denies SI, HI, or AVH at time of session. Pt denies substance use.   Psychiatric History   Have you ever been treated for a mental health problem? Yes If Yes, when were you treated and whom did you see (psychiatrist/counselor) ?     When several years ago       Name of provider Primary Care provider:  pt reports that her previous PCP wanted to send her for neuropsychological testing for ADHD. Pt reports that this provider also  prescribed her xanax to take prn    Psychiatric History  Depression: No Anxiety: Yes--picks at nails, restlessness Mania: Yes--impulsive at times, reckless spending Psychosis: No PTSD symptoms: No   Past  Psychiatric History/Hospitalization(s): Hospitalization for psychiatric illness: No Prior Suicide Attempts: No Prior Self-injurious behavior: No   Have you ever had thoughts of harming yourself or others or attempted suicide? No plan to harm self or others    Psychosocial stressors Flowsheet Row Integrated Behavioral Health from 09/10/2024 in [facility]  Current Stressors Unknown cause  Familial Stressors None  Sleep Decreased, Difficulty falling asleep, Difficulty staying asleep, Frequent awakening  [Pt reports falling asleep around 5am at least one time per week]  Appetite Decreased, Loss of appetite  [Pt reports that some days she will go all day without eating]  Coping ability Overwhelmed  Patient taking medications as prescribed Yes    Self-harm Behaviors Risk Assessment Flowsheet Row Integrated Behavioral Health from 09/10/2024 in [facility]  Self-harm risk factors Social withdrawal/isolation  Have you recently had any thoughts about harming yourself? No    Screenings PHQ-9 Assessments:     09/10/2024    1:33 PM 08/03/2024    3:12 PM 08/03/2024    2:50 PM  Depression screen PHQ 2/9  Decreased Interest 1 0 0  Down, Depressed, Hopeless 1 0 0  PHQ - 2 Score 2 0 0  Altered sleeping 3 0   Tired, decreased energy 2 1   Change in appetite 2 0   Feeling bad or failure about yourself  0 0   Trouble concentrating 3 1   Moving slowly or fidgety/restless 3 0   Suicidal thoughts 0 0   PHQ-9 Score 15 2    Difficult doing work/chores Very difficult Somewhat difficult      Data saved with a previous flowsheet row definition   GAD-7 Assessments:     09/10/2024    1:34 PM 08/03/2024    3:12 PM  GAD 7 : Generalized Anxiety Score  Nervous, Anxious, on Edge 3 3  Control/stop worrying 2 1  Worry too much - different things 3 1  Trouble relaxing 3 1  Restless 3 2  Easily annoyed or irritable 3 3  Afraid - awful might  happen 1 0  Total GAD 7 Score 18 11  Anxiety Difficulty Very difficult Somewhat difficult    Past Medical History Past Medical History:  Diagnosis Date   Anxiety    Asthma    Hypothyroidism    Medical history non-contributory    Personal history of asthma    as a child    Vital signs: There were no vitals filed for this visit.  Allergies:  Allergies as of 09/12/2024 - Review Complete 09/11/2024  Allergen Reaction Noted   Caffeine   08/03/2024   Stadol [butorphanol] Other (See Comments) 06/22/2017   Hydrocodone Nausea And Vomiting 08/03/2024   Sulfa antibiotics Hives and Rash 04/05/2015    Medication History Current medications:  Outpatient Encounter Medications as of 09/12/2024  Medication Sig   baclofen  (LIORESAL ) 10 MG tablet Take 1 tablet (10 mg total) by mouth 3 (three) times daily.   hydrOXYzine (ATARAX) 10 MG tablet Take 1 tablet (10 mg total) by mouth 3 (three) times daily as needed.   levothyroxine (SYNTHROID) 25 MCG tablet Take 25 mcg by mouth daily.   meloxicam  (MOBIC ) 15 MG tablet Take 1 tablet (15 mg total) by mouth daily.   methylPREDNISolone  (MEDROL  DOSEPAK) 4 MG  TBPK tablet 6 day dose pack - take as directed   traMADol  (ULTRAM ) 50 MG tablet Take 1 tablet (50 mg total) by mouth every 6 (six) hours as needed for severe pain (pain score 7-10).   No facility-administered encounter medications on file as of 09/12/2024.     Scribe for Treatment Team: Colton Tassin R [place] Ndiaye, LCSW

## 2024-09-18 LAB — COMPREHENSIVE METABOLIC PANEL WITH GFR
ALT: 35 IU/L — ABNORMAL HIGH (ref 0–32)
AST: 18 IU/L (ref 0–40)
Albumin: 4.6 g/dL (ref 3.9–4.9)
Alkaline Phosphatase: 206 IU/L — ABNORMAL HIGH (ref 41–116)
BUN/Creatinine Ratio: 13 (ref 9–23)
BUN: 9 mg/dL (ref 6–20)
Bilirubin Total: 0.4 mg/dL (ref 0.0–1.2)
CO2: 23 mmol/L (ref 20–29)
Calcium: 9.8 mg/dL (ref 8.7–10.2)
Chloride: 99 mmol/L (ref 96–106)
Creatinine, Ser: 0.7 mg/dL (ref 0.57–1.00)
Globulin, Total: 2.8 g/dL (ref 1.5–4.5)
Glucose: 86 mg/dL (ref 70–99)
Potassium: 4.4 mmol/L (ref 3.5–5.2)
Sodium: 137 mmol/L (ref 134–144)
Total Protein: 7.4 g/dL (ref 6.0–8.5)
eGFR: 119 mL/min/1.73 (ref >59)

## 2024-09-18 LAB — CBC WITH DIFFERENTIAL/PLATELET
Basophils Absolute: 0.1 x10E3/uL (ref 0.0–0.2)
Basos: 1 %
EOS (ABSOLUTE): 0.1 x10E3/uL (ref 0.0–0.4)
Eos: 1 %
Hematocrit: 44.9 % (ref 34.0–46.6)
Hemoglobin: 14.6 g/dL (ref 11.1–15.9)
Immature Grans (Abs): 0 x10E3/uL (ref 0.0–0.1)
Immature Granulocytes: 0 %
Lymphocytes Absolute: 3.8 x10E3/uL — ABNORMAL HIGH (ref 0.7–3.1)
Lymphs: 27 %
MCH: 30 pg (ref 26.6–33.0)
MCHC: 32.5 g/dL (ref 31.5–35.7)
MCV: 92 fL (ref 79–97)
Monocytes Absolute: 1.1 x10E3/uL — ABNORMAL HIGH (ref 0.1–0.9)
Monocytes: 8 %
Neutrophils Absolute: 9 x10E3/uL — ABNORMAL HIGH (ref 1.4–7.0)
Neutrophils: 63 %
Platelets: 319 x10E3/uL (ref 150–450)
RBC: 4.86 x10E6/uL (ref 3.77–5.28)
RDW: 11.9 % (ref 11.7–15.4)
WBC: 14.2 x10E3/uL — ABNORMAL HIGH (ref 3.4–10.8)

## 2024-09-18 LAB — TSH: TSH: 9.03 u[IU]/mL — ABNORMAL HIGH (ref 0.450–4.500)

## 2024-09-18 LAB — LIPID PANEL WITH LDL/HDL RATIO
Cholesterol, Total: 160 mg/dL (ref 100–199)
HDL: 65 mg/dL (ref >39)
LDL Chol Calc (NIH): 76 mg/dL (ref 0–99)
LDL/HDL Ratio: 1.2 ratio (ref 0.0–3.2)
Triglycerides: 107 mg/dL (ref 0–149)
VLDL Cholesterol Cal: 19 mg/dL (ref 5–40)

## 2024-09-18 LAB — HEPATITIS C ANTIBODY: Hep C Virus Ab: NONREACTIVE

## 2024-09-18 LAB — T4, FREE: Free T4: 1.02 ng/dL (ref 0.82–1.77)

## 2024-09-25 ENCOUNTER — Ambulatory Visit: Payer: Self-pay | Admitting: Physician Assistant

## 2024-09-25 DIAGNOSIS — E038 Other specified hypothyroidism: Secondary | ICD-10-CM

## 2024-09-25 DIAGNOSIS — E890 Postprocedural hypothyroidism: Secondary | ICD-10-CM

## 2024-09-25 MED ORDER — LEVOTHYROXINE SODIUM 25 MCG PO TABS: 25.0000 ug | ORAL_TABLET | Freq: Every day | ORAL | 0 refills | Status: AC

## 2024-09-26 NOTE — Patient Instructions (Signed)
 Heavy or Long-Lasting Menstrual Periods: What to Know Heavy or long-lasting periods are also called abnormal uterine bleeding. This is when your monthly periods are heavy or last longer than normal.  If you have this condition, bleeding and cramping may get so bad that they make it hard for you to do your daily activities. What are the causes? Common causes of this condition include: Growths in the uterus (polyps or fibroids). These growths are not cancer. Problems with the tissue that lines the uterus. This tissue may: Grow into the walls of the uterus (adenomyosis). Grow outside the uterus (endometriosis). One of the ovaries not releasing an egg during one or more months. A disorder that stops the blood from clotting normally. Some medicines. Infection. In some cases, the cause of this condition is not known. What increases the risk? You are more likely to get this condition if you have cancer of the uterus. What are the signs or symptoms? Symptoms of this condition include: Heavy bleeding. You may bleed so much that you have: To change your pad or tampon every 1-2 hours. To use pads and tampons at the same time. To wake up to change your pads or tampons during the night. Blood clots that are larger than 1 inch (2.5 cm) in size. Bleeding that lasts for more than 7 days. How is this diagnosed? This condition may be diagnosed based on a physical exam and your symptoms. Your health care provider will ask you about your period. You may also have tests, including: Blood tests. Pap test. Biopsy. A small piece of the tissue that lines the uterus is tested. Ultrasound of the uterus, ovaries, and vagina. Looking inside the uterus using a flexible tube with a light (hysteroscope). How is this treated? You may not need treatment for this condition. But if you need treatment, you may be given: Birth control pills or an IUD. Hormone therapy. Medicine to reduce swelling, such as  ibuprofen. Medicine to make growths in the uterus smaller. Medicines to make your blood clot. Iron pills. Antibiotics. These are given if your bleeding is caused by an infection. If medicines do not work, surgery may be done. Surgery may be done to: Remove a part of the lining of the uterus. Remove growths in the uterus. These may be polyps or fibroids. Remove the entire lining of the uterus. Remove the uterus entirely. Talk with your provider about your options for treatment. Some treatments may make it hard for you to get pregnant. Tell your provider if you'd like to get pregnant after treatment. Follow these instructions at home: Medicines Take your medicines only as told. Do not change or switch medicines without asking your provider. Do not take aspirin or medicines that have aspirin 1 week before your period, or during your period. Aspirin may make bleeding worse. Managing trouble pooping Your iron pills may cause trouble pooping (constipation). To help prevent or treat this, you may need to: Take medicines to help you poop. Eat foods high in fiber, like beans, whole grains, and fresh fruits and vegetables. Drink more fluids as told. General instructions If you need to change your pad or tampon more than once every 2 hours, limit your activity until the bleeding stops. Eat well-balanced meals, including foods that are high in iron. Foods that have a lot of iron include leafy, green vegetables, meat, liver, eggs, and whole-grain breads and cereals. Do not try to lose weight until the heavy bleeding has stopped and your blood iron level is  back to normal. If you need to lose weight, work with your provider to lose weight safely. Keep all follow-up visits. Your provider will make sure your treatment is working. Your provider may adjust your treatment plan if it is not working. Contact a health care provider if: You soak through a pad or tampon every 1 or 2 hours, and this happens every  time you have a period. You need to use pads and tampons at the same time because you are bleeding so much. You vomit or feel like you vomiting. You have watery poop (diarrhea). You have problems from the medicines you are taking. You feel very weak or tired. You feel dizzy or you faint. Get help right away if: You soak through more than a pad or tampon in 1 hour. You pass clots bigger than 1 inch (2.5 cm) wide. You feel short of breath. You feel like your heart is beating too fast. These symptoms may be an emergency. Call 911 right away. Do not wait to see if the symptoms will go away. Do not drive yourself to the hospital. This information is not intended to replace advice given to you by your health care provider. Make sure you discuss any questions you have with your health care provider. Document Revised: 08/16/2023 Document Reviewed: 03/29/2023 Elsevier Patient Education  2024 [employer].

## 2024-09-26 NOTE — Progress Notes (Unsigned)
 Ostwalt, Janna, PA-C   Chief Complaint  Patient presents with   Menorrhagia    HPI:      Joan Moreno is a 33 y.o. [id number] whose LMP was Patient's last menstrual period was 08/01/2024 (approximate)., presents today for irregular, heavy & painful periods.  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   Thyroid  nodule 10/30/2021    Past Surgical History:  Procedure Laterality Date   broke leg     put to sleep to put back in place   LAPAROSCOPIC TUBAL LIGATION Bilateral 08/15/2017   Procedure: LAPAROSCOPIC BILATERAL TUBAL LIGATION;  Surgeon: Connell Davies, MD;  Location: [facility] ORS;  Service: Gynecology;  Laterality: Bilateral;   THYROID  SURGERY Right 01/2022   TONSILLECTOMY      Family History  Problem Relation Age of Onset   Diabetes Mother        borderline   Diabetes Maternal Grandfather        aunt, uncle    Social History   Socioeconomic History   Marital status: Single    Spouse name: Not on file   Number of children: Not on file   Years of education: Not on file   Highest education level: 10th grade  Occupational History   Occupation: unemployed  Tobacco Use   Smoking status: Former    Current packs/day: 0.00    Average packs/day: 0.5 packs/day for 0.5 years (0.3 ttl pk-yrs)    Types: Cigarettes    Start date: 04/06/2014    Quit date: 10/06/2014    Years since quitting: 9.9   Smokeless tobacco: Never  Vaping Use   Vaping status: Never Used  Substance and Sexual Activity    Alcohol use: No    Alcohol/week: 0.0 standard drinks of alcohol   Drug use: No   Sexual activity: Not on file  Other Topics Concern   Not on file  Social History Narrative   Not on file   Social Drivers of Health   Tobacco Use: Medium Risk (09/27/2024)   Patient History    Smoking Tobacco Use: Former    Smokeless Tobacco Use: Never    Passive Exposure: Not on Actuary Strain: Low Risk (08/20/2024)   Overall Financial Resource Strain (CARDIA)    Difficulty of Paying Living Expenses: Not hard at all  Food Insecurity: No Food Insecurity (08/20/2024)   Epic    Worried About Programme Researcher, Broadcasting/film/video in the Last Year: Never true    Ran Out of Food in the Last Year: Never true  Transportation Needs: No Transportation Needs (08/20/2024)   Epic    Lack of Transportation (Medical): No    Lack of Transportation (Non-Medical): No  Physical Activity: Insufficiently Active (08/20/2024)   Exercise Vital Sign    Days of Exercise per Week: 3 days    Minutes of Exercise per Session: 20 min  Stress: Stress Concern Present (08/20/2024)   [employer] of Occupational Health - Occupational Stress Questionnaire    Feeling of Stress: Rather much  Social Connections: Moderately Integrated (08/20/2024)   Social Connection  and Isolation Panel    Frequency of Communication with Friends and Family: More than three times a week    Frequency of Social Gatherings with Friends and Family: Once a week    Attends Religious Services: 1 to 4 times per year    Active Member of [employer] or Organizations: No    Attends Engineer, Structural: Not on file    Marital Status: Living with partner  Intimate Partner Violence: Not At Risk (08/03/2024)   Epic    Fear of Current or Ex-Partner: No    Emotionally Abused: No    Physically Abused: No    Sexually Abused: No  Depression (PHQ2-9): High Risk (09/10/2024)   Depression (PHQ2-9)    PHQ-2 Score: 15  Alcohol Screen: Low Risk (08/20/2024)    Alcohol Screen    Last Alcohol Screening Score (AUDIT): 5  Housing: Low Risk (08/20/2024)   Epic    Unable to Pay for Housing in the Last Year: No    Number of Times Moved in the Last Year: 0    Homeless in the Last Year: No  Utilities: Not At Risk (08/03/2024)   Epic    Threatened with loss of utilities: No  Health Literacy: Adequate Health Literacy (08/03/2024)   B1300 Health Literacy    Frequency of need for help with medical instructions: Never    Outpatient Medications Prior to Visit  Medication Sig Dispense Refill   hydrOXYzine (ATARAX) 10 MG tablet Take 1 tablet (10 mg total) by mouth 3 (three) times daily as needed. 30 tablet 0   levothyroxine  (SYNTHROID ) 25 MCG tablet Take 1 tablet (25 mcg total) by mouth daily. 90 tablet 0   meloxicam  (MOBIC ) 15 MG tablet Take 1 tablet (15 mg total) by mouth daily. 60 tablet 1   baclofen  (LIORESAL ) 10 MG tablet Take 1 tablet (10 mg total) by mouth 3 (three) times daily. (Patient not taking: Reported on 09/27/2024) 30 each 0   methylPREDNISolone  (MEDROL  DOSEPAK) 4 MG TBPK tablet 6 day dose pack - take as directed 21 tablet 0   traMADol  (ULTRAM ) 50 MG tablet Take 1 tablet (50 mg total) by mouth every 6 (six) hours as needed for severe pain (pain score 7-10). 10 tablet 0   No facility-administered medications prior to visit.      ROS:  Review of Systems   OBJECTIVE:   Vitals:  BP (!) 129/101   Pulse (!) 105   Ht 5' 6 (1.676 m)   Wt 181 lb (82.1 kg)   LMP 08/01/2024 (Approximate)   BMI 29.21 kg/m   Physical Exam  Results: No results found for this or any previous visit (from the past 24 hours).   Assessment/Plan: 1. Menorrhagia with irregular cycle (Primary) - US  PELVIC COMPLETE WITH TRANSVAGINAL; Future  2. Encounter for screening for human papillomavirus (HPV) - Cytology - PAP  3. Cervical cancer screening - Cytology - PAP  4. Screen for sexually transmitted diseases - Cervicovaginal ancillary only  5. Enlarged  uterus - US  PELVIC COMPLETE WITH TRANSVAGINAL; Future     No orders of the defined types were placed in this encounter.    Harlene LITTIE Cisco, CNM 09/27/2024 1:32 PM

## 2024-09-27 ENCOUNTER — Ambulatory Visit: Admitting: Certified Nurse Midwife

## 2024-09-27 ENCOUNTER — Encounter: Payer: Self-pay | Admitting: Certified Nurse Midwife

## 2024-09-27 ENCOUNTER — Other Ambulatory Visit (HOSPITAL_COMMUNITY)
Admission: RE | Admit: 2024-09-27 | Discharge: 2024-09-27 | Disposition: A | Discharge status: Home / Self Care | Source: Ambulatory Visit | Attending: Certified Nurse Midwife | Admitting: Certified Nurse Midwife

## 2024-09-27 VITALS — BP 129/101 | HR 105 | Ht 66.0 in | Wt 181.0 lb

## 2024-09-27 DIAGNOSIS — N921 Excessive and frequent menstruation with irregular cycle: Secondary | ICD-10-CM

## 2024-09-27 DIAGNOSIS — Z113 Encounter for screening for infections with a predominantly sexual mode of transmission: Secondary | ICD-10-CM

## 2024-09-27 DIAGNOSIS — Z1151 Encounter for screening for human papillomavirus (HPV): Secondary | ICD-10-CM

## 2024-09-27 DIAGNOSIS — Z124 Encounter for screening for malignant neoplasm of cervix: Secondary | ICD-10-CM

## 2024-09-27 DIAGNOSIS — N852 Hypertrophy of uterus: Secondary | ICD-10-CM

## 2024-09-28 LAB — CYTOLOGY - PAP
Comment: NEGATIVE
Diagnosis: NEGATIVE
High risk HPV: NEGATIVE

## 2024-09-28 LAB — CERVICOVAGINAL ANCILLARY ONLY
Chlamydia: NEGATIVE
Comment: NEGATIVE
Comment: NEGATIVE
Comment: NORMAL
Neisseria Gonorrhea: NEGATIVE
Trichomonas: NEGATIVE

## 2024-10-01 ENCOUNTER — Ambulatory Visit: Payer: Self-pay | Admitting: Certified Nurse Midwife

## 2024-10-01 MED ORDER — METRONIDAZOLE 500 MG PO TABS: 500.0000 mg | ORAL_TABLET | Freq: Two times a day (BID) | ORAL | 0 refills | Status: AC

## 2024-10-03 ENCOUNTER — Telehealth: Payer: Self-pay

## 2024-10-03 NOTE — Telephone Encounter (Signed)
 Patient's Orthotics are in, and being sent to [place].

## 2024-10-05 ENCOUNTER — Ambulatory Visit: Admitting: Licensed Clinical Social Worker

## 2024-10-05 DIAGNOSIS — F411 Generalized anxiety disorder: Secondary | ICD-10-CM

## 2024-10-05 DIAGNOSIS — F330 Major depressive disorder, recurrent, mild: Secondary | ICD-10-CM

## 2024-10-05 NOTE — BH Specialist Note (Unsigned)
 Virtual Visit via Video Note  I connected with Joan Moreno on 10/05/2024 at  1:30 PM EST by a video enabled telemedicine application and verified that I am speaking with the correct person using two identifiers.  Location: Patient: Primary residence, [place] Provider: Clinician virtual office, [place]   I discussed the limitations of evaluation and management by telemedicine and the availability of in person appointments. The patient expressed understanding and agreed to proceed.   I discussed the assessment and treatment plan with the patient. The patient was provided an opportunity to ask questions and all were answered. The patient agreed with the plan and demonstrated an understanding of the instructions.    Integrated Behavioral Health Follow Apolinar Carrier  MRN: [id number] Name: JORRYN Moreno  Number of Integrated Behavioral Health Clinician visits: 3- Third Visit  Session Start time: 1330   Session End time: 1400  Total time in minutes: 30    Types of Service: {CHL AMB TYPE OF [phone]}  Interpretor:{yes wn:[id number]} Interpretor Name and Language: ***  Subjective: Joan Moreno is a 33 y.o. female accompanied by {Patient accompanied by:[phone]} Patient was referred by *** for ***.    Patient reports the following symptoms/concerns: *** Duration of problem: ***; Severity of problem: {Mild/Moderate/Severe:20260}  Objective: Mood: {BHH MOOD:22306} and Affect: {BHH AFFECT:22307} Risk of harm to self or others: {CHL AMB BH Suicide Current Mental Status:21022748}  Life Context: Family and Social: ***  School/Work: ***  Self-Care: ***  Life Changes: ***  Easy distracted, can't follow through on tasks, insomnia, anxiety, overwhelmed, scatterbrained, extra energy  Patient and/or Family's Strengths/Protective Factors: Social connections and Concrete supports in place (healthy food, safe environments, etc.)  Goals Addressed: Patient will:  Reduce  symptoms of: {IBH Symptoms:21014056}   Increase knowledge and/or ability of: {IBH Patient Tools:21014057}   Demonstrate ability to: {IBH Goals:21014053}  Progress towards Goals: {CHL AMB BH PROGRESS TOWARDS GOALS:[phone]}  Interventions: Interventions utilized:  {IBH Interventions:21014054} Standardized Assessments completed: C-SSRS Short, GAD-7, and PHQ 9     10/05/2024    1:45 PM 09/10/2024    1:33 PM 08/03/2024    3:12 PM  Depression screen PHQ 2/9  Decreased Interest 1 1 0  Down, Depressed, Hopeless 0 1 0  PHQ - 2 Score 1 2 0  Altered sleeping 3 3 0  Tired, decreased energy 1 2 1   Change in appetite 1 2 0  Feeling bad or failure about yourself  0 0 0  Trouble concentrating 3 3 1   Moving slowly or fidgety/restless 3 3 0  Suicidal thoughts 0 0 0  PHQ-9 Score 12 15 2    Difficult doing work/chores Somewhat difficult Very difficult Somewhat difficult     Data saved with a previous flowsheet row definition       10/05/2024    1:50 PM 09/10/2024    1:34 PM 08/03/2024    3:12 PM  GAD 7 : Generalized Anxiety Score  Nervous, Anxious, on Edge 3 3 3   Control/stop worrying 2 2 1   Worry too much - different things 2 3 1   Trouble relaxing 3 3 1   Restless 2 3 2   Easily annoyed or irritable 3 3 3   Afraid - awful might happen 0 1 0  Total GAD 7 Score 15 18 11   Anxiety Difficulty Very difficult Very difficult Somewhat difficult    Flowsheet Row Integrated Behavioral Health from 10/05/2024 in [facility] UC from 09/11/2024 in [facility]  Health from 09/10/2024 in [facility]  C-SSRS RISK CATEGORY No Risk No Risk No Risk       Patient and/or Family Response: ***  Patient Centered Plan: Patient is on the following Treatment Plan(s): ***  Clinical Assessment/Diagnosis  No diagnosis found.    Assessment: Patient currently experiencing ***.   Patient may benefit from  ***.  Plan: Follow up with behavioral health clinician on : *** Behavioral recommendations: *** Referral(s): {IBH Referrals:21014055}  Thad Osoria R Lanah Steines, LCSW

## 2024-10-07 NOTE — Patient Instructions (Signed)
 Using Behavioral Activation to manage stress/depression symptoms     Identify/understand your own mood triggers.   Structure your day--get up around the same time, eat meals/snacks around the same time, go to bed around the same time.   Purposefully schedule self care time and time to complete tasks. This can include quiet time.  Stimulate your brain--go for a walk, text/call a friend or family member, if you are indoors--go outside (and vice versa), go for a drive, go to a store with bright colors and bright lights. Try to do things in a different way--drive to your favorite places using an alternative route, or instead of starting on the right side of the grocery store when shopping, start on the left side. You might feel a bit uncomfortable doing things outside of the comfort zone, but this is helping the brain create new neural pathways and is very healthy for brain/emotional health.   Physical movement based on your ability. If you can go for a walk, do stretches, even waving your hands to music can trigger feel-good endorphins in the brain and help release physical tension we all hold in our bodies.  Even 5 minutes can make a difference.   Be intentional about doing things that bring you joy (or used to bring you joy), and look for the things in every day that make you happy.  Seek those glimmers of joy each day.  Set a timer for 5 minutes for a harder task (ex. Laundry, washing dishes).  Allow yourself to work distraction-free for 5 minutes, then stop when the timer goes off. If you need a break, take a break. If you want to continue working then set another timer for whatever time you choose.   Limit or eliminate substance use including alcohol, marijuana, or recreational use of prescription medication.  Let in the light!! Open the window blinds, curtains and let natural light in. Even sitting near a window or sitting outside can boost your mood, especially in the wintertime when  there is less daylight.   If you take medication to manage symptoms, remember to take all medications as they are prescribed (please read all labels!!)    Things to envision for ourselves to to improve inspiration, motivation, and initiative :  improving physical wellness, focus on family relationships, focusing on our own mental/emotional well being, being a part of a bigger community, finding a hobby, being a part of something that fosters personal growth, engaging socially with others (even digitally!!!)    ANXIETY/PANIC EPISODE MANAGEMENT (CBT/MINDFULNESS BASED)   If you are in a highly stimulating or triggering environment, change your location to a less stimulating or safer environment .   Stimulate your senses by tasting/eating a sour candy (such as a lemon drop) or a strong flavored cough drop.  This triggers smell, taste, and touch since we  have had lots of nerve endings inside of your mouth.   Practice slow, controlled breathing to avoid hyperventilation.  Breathing into your nose for the count of 4 inside your head, holding your breath for the count of 4 inside your head, and exhale slowly counting to 8 inside your head.  This is called 4-4-8 breathing, or triangle breathing. (Controlled breathing). This helps manage panic, anxiety, anger, and tearfulness.  Additional grounding exercises include rubbing your hands softly together, or wiggling toes inside your shoes, pretending to grasp the floor with your toes.    Listen to calming music or sounds  Count backwards in your head by  twos or tens or recite multiplication tables in your head.  Visualize a calming, happy place and identify 5 explicit details about this place (color, temperature, smells, visual details, etc.)  Splash cold water on your face or hold an ice cube in your hand (alternate hands)  Clench and release muscle groups (hands, shoulders, facial muscles)  Engage in soothing activities to recover after a  stressful/anxious episode such as: drinking a warm beverage, sitting in your favorite comfortable location, positive physical contact with a pet or weighted blanket, using positive self talk/positive affirmations.   Download PTSD Coach app for your phone or tablet--its free and has lots of tips to help you manage panic episodes no matter where you are!    Emergency Resources:  National Suicide & Crisis Lifeline: Call or text 988  Crisis Text Line: Text HOME to 332-080-6994  [facility]  [address] [place], [place] [address] [phone] or [phone] [facility] 24/7 FOR ANYONE [address], [place], [place]  [phone] Fax: [phone] [email or web address] *Interpreters available *Accepts all insurance and uninsured for Urgent Care needs *Accepts Medicaid and uninsured for outpatient treatment (below)

## 2024-10-10 ENCOUNTER — Encounter: Payer: Self-pay | Admitting: Physician Assistant

## 2024-10-23 ENCOUNTER — Other Ambulatory Visit: Payer: Self-pay | Admitting: Certified Nurse Midwife

## 2024-10-23 ENCOUNTER — Ambulatory Visit

## 2024-10-23 DIAGNOSIS — N921 Excessive and frequent menstruation with irregular cycle: Secondary | ICD-10-CM

## 2024-10-23 DIAGNOSIS — Z1151 Encounter for screening for human papillomavirus (HPV): Secondary | ICD-10-CM

## 2024-10-23 DIAGNOSIS — N852 Hypertrophy of uterus: Secondary | ICD-10-CM

## 2024-10-23 DIAGNOSIS — Z124 Encounter for screening for malignant neoplasm of cervix: Secondary | ICD-10-CM

## 2024-10-23 DIAGNOSIS — Z113 Encounter for screening for infections with a predominantly sexual mode of transmission: Secondary | ICD-10-CM

## 2024-10-26 ENCOUNTER — Ambulatory Visit: Admitting: Licensed Clinical Social Worker

## 2024-10-26 DIAGNOSIS — F411 Generalized anxiety disorder: Secondary | ICD-10-CM

## 2024-10-26 DIAGNOSIS — F331 Major depressive disorder, recurrent, moderate: Secondary | ICD-10-CM

## 2024-10-26 NOTE — BH Specialist Note (Signed)
 Virtual Visit via Video Note  I connected with Joan Moreno on 10/26/2024 at  1:30 PM EST by a video enabled telemedicine application and verified that I am speaking with the correct person using two identifiers.  Location: Patient: Primary residence, [place] Provider: Clinician virtual office, [place]   I discussed the limitations of evaluation and management by telemedicine and the availability of in person appointments. The patient expressed understanding and agreed to proceed.   I discussed the assessment and treatment plan with the patient. The patient was provided an opportunity to ask questions and all were answered. The patient agreed with the plan and demonstrated an understanding of the instructions.    Integrated Behavioral Health Follow Apolinar Carrier  MRN: [id number] Name: Joan Moreno  Number of Integrated Behavioral Health Clinician visits: 4- Fourth Visit  Session Start time: 1335   Session End time: 1400  Total time in minutes: 25  Encounter Diagnoses  Name Primary?   MDD (major depressive disorder), recurrent episode, moderate (HCC) Yes   Generalized anxiety disorder      Types of Service: Video visit and Collaborative care   Subjective: Joan Moreno is a 34 y.o. female with history of a anxiety.en in consultation at the request of Janna Ostwalt PA C for establishment of [facility] Collaborative Care management. Pt is currently taking the following psychiatric medications: hydroxyzine  . Current symptoms include: feeling nervous/anxious/on edge, worrying too much about different things, increased irritability, fearful as if something bad might happen, little interest or pleasure in doing things, feeling tired or having little energy, poor appetite, trouble focusing and concentrating on things, feeling fidgety and restless. Patient reports that she has panic attacks daily. Patient reports when she has a panic attack she feels like her heart is racing, and a generalized feeling  of  freaking out. pt denies SI, HI, or AVH at time of session. Pt denies substance use     Duration of problem: ongoing; Severity of problem: moderate  Objective: Mood: Anxious and Depressed and Affect: Appropriate Risk of harm to self or others: No plan to harm self or others  Life Context: Patient reports that she had stress associated with the holidays because she was the host, and had all the family at her house.  Patient reports that she was the one that cooked all of the meals, passed out all of the presents, and did all of the decorations.  Patient reports that she enjoys this, but felt overstimulated and overwhelmed at times.  Patient reports that she used her coping skills and allowed herself breaks and moments of time out when she felt like it was too much.  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  Patient reports that she was also helping her father during the holiday gathering.  Patient reports that she did have 1 week off of work, and feels that she needed that time for planning and recovering.   Sleep: Patient reporting poor quality and quantity of sleep on most nights during the week.  Patient reports that she feels her mind is racing, and will have intrusive thoughts at night.  Patient reports that she was given a prescription for trazodone in the past but has not taken it.  Patient and/or Family's Strengths/Protective Factors: Social connections and [place] supports in place (healthy food, safe environments, etc.)  Goals Addressed: Patient will:  Reduce symptoms of: anxiety and depression  Increase knowledge and/or ability of: coping skills, healthy habits, self-management skills, and stress reduction   Demonstrate ability to: Increase healthy adjustment to current life circumstances  Progress towards Goals: Ongoing  Interventions: Interventions utilized:  Solution-Focused  Strategies, Mindfulness or Management Consultant, and Behavioral Activation Standardized Assessments completed: C-SSRS Short, GAD-7, and PHQ 9     10/26/2024    1:50 PM 10/05/2024    1:45 PM 09/10/2024    1:33 PM  Depression screen PHQ 2/9  Decreased Interest 2 1 1   Down, Depressed, Hopeless 0 0 1  PHQ - 2 Score 2 1 2   Altered sleeping 3 3 3   Tired, decreased energy 1 1 2   Change in appetite 1 1 2   Feeling bad or failure about yourself  0 0 0  Trouble concentrating 3 3 3   Moving slowly or fidgety/restless 3 3 3   Suicidal thoughts 0 0 0  PHQ-9 Score 13 12 15   Difficult doing work/chores  Somewhat difficult Very difficult       10/26/2024    1:54 PM 10/05/2024    1:50 PM 09/10/2024    1:34 PM 08/03/2024    3:12 PM  GAD 7 : Generalized Anxiety Score  Nervous, Anxious, on Edge 2 3 3 3   Control/stop worrying 0 2 2 1   Worry too much - different things 0 2 3 1   Trouble relaxing 3 3 3 1   Restless 1 2 3 2   Easily annoyed or irritable 1 3 3 3   Afraid - awful might happen 0 0 1 0  Total GAD 7 Score 7 15 18 11   Anxiety Difficulty  Very difficult Very difficult Somewhat difficult    Flowsheet Row Integrated Behavioral Health from 10/26/2024 in [facility] Integrated Behavioral Health from 10/05/2024 in [facility] UC from 09/11/2024 in [facility] [place]   C-SSRS RISK CATEGORY No Risk No Risk No Risk     Patient and/or Family Response: Pt agreeable to start recommended medication   Patient Centered Plan: Patient is on the following Treatment Plan(s):  1. Recommend Lexapro  10 mg daily 2. Discontinue hydroxyzine  10 mg TID PRN, no longer effective 3. Recommend gabapentin 100 mg BID PRN anxiety 4. Labs and vitamin D levels needed 5. BH specialist to follow up.  Clinical Assessment/Diagnosis  Encounter Diagnoses  Name Primary?   MDD (major depressive disorder), recurrent episode, moderate (HCC) Yes   Generalized  anxiety disorder      Assessment: Patient currently experiencing ongoing symptoms of depression and anxiety.  Patient states that she did not have any medication after IBH treatment plan session.  IBH clinician to send a message to primary care about medication recommendations.  Patient may benefit from [facility] supports, medication management of symptoms .  Plan: Follow up with behavioral health clinician on : 11/09/24 Behavioral recommendations:  Start medications recommended on tx plan (Lexapro  10 and gabapentin 100 bid) Behavioral activation strategies CBT/Mindfulness strategies Referral(s): Integrated [employer] (In Clinic)  [place] R Evely Gainey, LCSW

## 2024-10-26 NOTE — Patient Instructions (Signed)
 Using Behavioral Activation to manage stress/depression symptoms     Identify/understand your own mood triggers.   Structure your day--get up around the same time, eat meals/snacks around the same time, go to bed around the same time.   Purposefully schedule self care time and time to complete tasks. This can include quiet time.  Stimulate your brain--go for a walk, text/call a friend or family member, if you are indoors--go outside (and vice versa), go for a drive, go to a store with bright colors and bright lights. Try to do things in a different way--drive to your favorite places using an alternative route, or instead of starting on the right side of the grocery store when shopping, start on the left side. You might feel a bit uncomfortable doing things outside of the comfort zone, but this is helping the brain create new neural pathways and is very healthy for brain/emotional health.   Physical movement based on your ability. If you can go for a walk, do stretches, even waving your hands to music can trigger feel-good endorphins in the brain and help release physical tension we all hold in our bodies.  Even 5 minutes can make a difference.   Be intentional about doing things that bring you joy (or used to bring you joy), and look for the things in every day that make you happy.  Seek those glimmers of joy each day.  Set a timer for 5 minutes for a harder task (ex. Laundry, washing dishes).  Allow yourself to work distraction-free for 5 minutes, then stop when the timer goes off. If you need a break, take a break. If you want to continue working then set another timer for whatever time you choose.   Limit or eliminate substance use including alcohol, marijuana, or recreational use of prescription medication.  Let in the light!! Open the window blinds, curtains and let natural light in. Even sitting near a window or sitting outside can boost your mood, especially in the wintertime when  there is less daylight.   If you take medication to manage symptoms, remember to take all medications as they are prescribed (please read all labels!!)    Things to envision for ourselves to to improve inspiration, motivation, and initiative :  improving physical wellness, focus on family relationships, focusing on our own mental/emotional well being, being a part of a bigger community, finding a hobby, being a part of something that fosters personal growth, engaging socially with others (even digitally!!!)

## 2024-10-28 ENCOUNTER — Ambulatory Visit: Payer: Self-pay | Admitting: Certified Nurse Midwife

## 2024-10-29 ENCOUNTER — Ambulatory Visit (INDEPENDENT_AMBULATORY_CARE_PROVIDER_SITE_OTHER): Admitting: Physician Assistant

## 2024-10-29 ENCOUNTER — Encounter: Payer: Self-pay | Admitting: Physician Assistant

## 2024-10-29 VITALS — BP 124/73 | HR 114 | Resp 14 | Ht 66.0 in | Wt 178.4 lb

## 2024-10-29 DIAGNOSIS — R5383 Other fatigue: Secondary | ICD-10-CM

## 2024-10-29 DIAGNOSIS — E66811 Obesity, class 1: Secondary | ICD-10-CM

## 2024-10-29 DIAGNOSIS — E890 Postprocedural hypothyroidism: Secondary | ICD-10-CM

## 2024-10-29 DIAGNOSIS — R030 Elevated blood-pressure reading, without diagnosis of hypertension: Secondary | ICD-10-CM

## 2024-10-29 DIAGNOSIS — F419 Anxiety disorder, unspecified: Secondary | ICD-10-CM

## 2024-10-29 MED ORDER — ESCITALOPRAM OXALATE 10 MG PO TABS: 10.0000 mg | ORAL_TABLET | Freq: Every day | ORAL | 1 refills | Status: DC

## 2024-10-29 NOTE — Progress Notes (Signed)
 " Established patient visit  Patient: Joan Moreno   DOB: 1990/10/22   34 y.o. Female  MRN: [id number] Visit Date: 10/29/2024  Today's healthcare provider: Jolynn Spencer, PA-C   Chief Complaint  Patient presents with   Hypertension    3 week f/u Has been staying high. Complains of having a headache x 1 week    Subjective     HPI     Hypertension    Additional comments: 3 week f/u Has been staying high. Complains of having a headache x 1 week       Last edited by Wilfred Hargis RAMAN, CMA on 10/29/2024  9:33 AM.       Discussed the use of AI scribe software for clinical note transcription with the patient, who gave verbal consent to proceed.  History of Present Illness Joan Moreno is a 34 year old female with hypertension and thyroid  issues who presents for blood pressure monitoring and thyroid  management.  She monitors her blood pressure at home in the morning and evening and is concerned about elevated readings, though she has no headache, dizziness, or visual changes. She takes levothyroxine  for thyroid  management.       10/26/2024    1:50 PM 10/05/2024    1:45 PM 09/10/2024    1:33 PM  Depression screen PHQ 2/9  Decreased Interest 2 1 1   Down, Depressed, Hopeless 0 0 1  PHQ - 2 Score 2 1 2   Altered sleeping 3 3 3   Tired, decreased energy 1 1 2   Change in appetite 1 1 2   Feeling bad or failure about yourself  0 0 0  Trouble concentrating 3 3 3   Moving slowly or fidgety/restless 3 3 3   Suicidal thoughts 0 0 0  PHQ-9 Score 13 12 15   Difficult doing work/chores  Somewhat difficult Very difficult      10/26/2024    1:54 PM 10/05/2024    1:50 PM 09/10/2024    1:34 PM 08/03/2024    3:12 PM  GAD 7 : Generalized Anxiety Score  Nervous, Anxious, on Edge 2 3 3 3   Control/stop worrying 0 2 2 1   Worry too much - different things 0 2 3 1   Trouble relaxing 3 3 3 1   Restless 1 2 3 2   Easily annoyed or irritable 1 3 3 3   Afraid - awful might happen 0 0 1 0  Total  GAD 7 Score 7 15 18 11   Anxiety Difficulty  Very difficult Very difficult Somewhat difficult    Medications: Show/hide medication list[1]  Review of Systems All negative Except see HPI       Objective    BP 124/73   Pulse (!) 114   Resp 14   Ht 5' 6 (1.676 m)   Wt 178 lb 6.4 oz (80.9 kg)   LMP 08/01/2024   SpO2 98%   BMI 28.79 kg/m     Physical Exam Vitals reviewed.  Constitutional:      General: She is not in acute distress.    Appearance: Normal appearance. She is well-developed. She is obese. She is not diaphoretic.  HENT:     Head: Normocephalic and atraumatic.     Nose: Nose normal.  Eyes:     General: No scleral icterus.    Extraocular Movements: Extraocular movements intact.     Conjunctiva/sclera: Conjunctivae normal.     Pupils: Pupils are equal, round, and reactive to light.  Neck:     Thyroid : No thyromegaly.  Cardiovascular:     Rate and Rhythm: Normal rate and regular rhythm.     Pulses: Normal pulses.     Heart sounds: Normal heart sounds. No murmur heard. Pulmonary:     Effort: Pulmonary effort is normal. No respiratory distress.     Breath sounds: Normal breath sounds. No wheezing, rhonchi or rales.  Abdominal:     General: Abdomen is flat. Bowel sounds are normal.     Palpations: Abdomen is soft.  Musculoskeletal:        General: Normal range of motion.     Cervical back: Normal range of motion and neck supple.     Right lower leg: No edema.     Left lower leg: No edema.  Lymphadenopathy:     Cervical: No cervical adenopathy.  Skin:    General: Skin is warm and dry.     Findings: No rash.  Neurological:     Mental Status: She is alert and oriented to person, place, and time. Mental status is at baseline.     Cranial Nerves: No cranial nerve deficit.     Sensory: No sensory deficit.     Motor: No weakness.     Coordination: Coordination normal.     Gait: Gait normal.     Deep Tendon Reflexes: Reflexes normal.  Psychiatric:         Behavior: Behavior normal.        Thought Content: Thought content normal.        Judgment: Judgment normal.      No results found for any visits on 10/29/24.      Assessment & Plan Elevated blood pressure Blood pressure readings are concerning and require monitoring. - Measure blood pressure twice daily, morning and evening, and record readings. - Bring blood pressure device to next appointment. - Schedule an earlier appointment if blood pressure remains elevated.  Postoperative hypothyroidism Current management with levothyroxine . Potential need for dosage adjustment based on future thyroid  function tests. - Continue current levothyroxine  dosage. - Consider referral to endocrinology if thyroid  function tests indicate a need for dosage adjustment.  Anxiety disorder Chronic and unstable Initial management with Lexapro  10. Gabapentin use to be reassessed at next visit. - Continue Lexapro  for anxiety management. - Reassess gabapentin use at next visit. Relaxation/mindfulness techniques were recommended. Consider Psychodynamic psychotherapy might be considered : patient discovering and verbalizing unconscious conflicts  Will follow-up  1. Postoperative hypothyroidism   - CBC with Differential/Platelet - Comprehensive metabolic panel with GFR - TSH - Ambulatory referral to Endocrinology  2. Elevated blood pressure reading  - CBC with Differential/Platelet - Comprehensive metabolic panel with GFR - TSH  3. Overweight Chronic  Body mass index is 28.79 kg/m. ordered - CBC with Differential/Platelet - Comprehensive metabolic panel with GFR - TSH Will reassess after  receiving lab results Will FU  4. Other fatigue Chronic and stable ordered - CBC with Differential/Platelet - Comprehensive metabolic panel with GFR - TSH Will reassess after  receiving lab results Will FU  5. Anxiety  - escitalopram  (LEXAPRO ) 10 MG tablet; Take 1 tablet (10 mg total) by mouth  daily.  Dispense: 30 tablet; Refill: 1  Orders Placed This Encounter  Procedures   CBC with Differential/Platelet   Comprehensive metabolic panel with GFR   TSH   Ambulatory referral to Endocrinology    Referral Priority:   Routine    Referral Type:   Consultation    Referral Reason:   Specialty Services Required    Number  of Visits Requested:   1    Return in about 4 weeks (around 11/26/2024) for chronic disease f/u, BP f/u.   The patient was advised to call back or seek an in-person evaluation if the symptoms worsen or if the condition fails to improve as anticipated.  I discussed the assessment and treatment plan with the patient. The patient was provided an opportunity to ask questions and all were answered. The patient agreed with the plan and demonstrated an understanding of the instructions.  I, Bekki Tavenner, PA-C have reviewed all documentation for this visit. The documentation on 10/29/2024  for the exam, diagnosis, procedures, and orders are all accurate and complete.  Jolynn Spencer, [facility], MMS [facility] [phone] (phone) [phone] (fax)  [facility]    [1]  Outpatient Medications Prior to Visit  Medication Sig   levothyroxine  (SYNTHROID ) 25 MCG tablet Take 1 tablet (25 mcg total) by mouth daily.   [DISCONTINUED] hydrOXYzine  (ATARAX ) 10 MG tablet Take 1 tablet (10 mg total) by mouth 3 (three) times daily as needed.   [DISCONTINUED] meloxicam  (MOBIC ) 15 MG tablet Take 1 tablet (15 mg total) by mouth daily.   [DISCONTINUED] baclofen  (LIORESAL ) 10 MG tablet Take 1 tablet (10 mg total) by mouth 3 (three) times daily. (Patient not taking: Reported on 09/27/2024)   No facility-administered medications prior to visit.   "

## 2024-10-30 LAB — COMPREHENSIVE METABOLIC PANEL WITH GFR
ALT: 16 IU/L (ref 0–32)
AST: 15 IU/L (ref 0–40)
Albumin: 4.5 g/dL (ref 3.9–4.9)
Alkaline Phosphatase: 183 IU/L — ABNORMAL HIGH (ref 41–116)
BUN/Creatinine Ratio: 14 (ref 9–23)
BUN: 9 mg/dL (ref 6–20)
Bilirubin Total: 0.5 mg/dL (ref 0.0–1.2)
CO2: 22 mmol/L (ref 20–29)
Calcium: 9.5 mg/dL (ref 8.7–10.2)
Chloride: 100 mmol/L (ref 96–106)
Creatinine, Ser: 0.66 mg/dL (ref 0.57–1.00)
Globulin, Total: 2.9 g/dL (ref 1.5–4.5)
Glucose: 98 mg/dL (ref 70–99)
Potassium: 4.6 mmol/L (ref 3.5–5.2)
Sodium: 136 mmol/L (ref 134–144)
Total Protein: 7.4 g/dL (ref 6.0–8.5)
eGFR: 120 mL/min/1.73

## 2024-10-30 LAB — CBC WITH DIFFERENTIAL/PLATELET
Basophils Absolute: 0.1 x10E3/uL (ref 0.0–0.2)
Basos: 1 %
EOS (ABSOLUTE): 0.2 x10E3/uL (ref 0.0–0.4)
Eos: 1 %
Hematocrit: 42.8 % (ref 34.0–46.6)
Hemoglobin: 13.8 g/dL (ref 11.1–15.9)
Immature Grans (Abs): 0 x10E3/uL (ref 0.0–0.1)
Immature Granulocytes: 0 %
Lymphocytes Absolute: 2.5 x10E3/uL (ref 0.7–3.1)
Lymphs: 21 %
MCH: 28.6 pg (ref 26.6–33.0)
MCHC: 32.2 g/dL (ref 31.5–35.7)
MCV: 89 fL (ref 79–97)
Monocytes Absolute: 0.9 x10E3/uL (ref 0.1–0.9)
Monocytes: 8 %
Neutrophils Absolute: 8.2 x10E3/uL — ABNORMAL HIGH (ref 1.4–7.0)
Neutrophils: 69 %
Platelets: 272 x10E3/uL (ref 150–450)
RBC: 4.83 x10E6/uL (ref 3.77–5.28)
RDW: 11.7 % (ref 11.7–15.4)
WBC: 11.9 x10E3/uL — ABNORMAL HIGH (ref 3.4–10.8)

## 2024-10-30 LAB — TSH: TSH: 3.98 u[IU]/mL (ref 0.450–4.500)

## 2024-11-05 ENCOUNTER — Ambulatory Visit: Payer: Self-pay | Admitting: Physician Assistant

## 2024-11-09 ENCOUNTER — Ambulatory Visit: Admitting: Licensed Clinical Social Worker

## 2024-11-09 DIAGNOSIS — F330 Major depressive disorder, recurrent, mild: Secondary | ICD-10-CM

## 2024-11-09 DIAGNOSIS — F411 Generalized anxiety disorder: Secondary | ICD-10-CM

## 2024-11-09 NOTE — BH Specialist Note (Signed)
 Virtual Visit via Video Note  I connected with Joan Moreno on 11/09/24 at  1:00 PM EST by a video enabled telemedicine application and verified that I am speaking with the correct person using two identifiers.  Location: Patient: Primary residence, [place] Provider: Clinician virtual office, [place]   I discussed the limitations of evaluation and management by telemedicine and the availability of in person appointments. The patient expressed understanding and agreed to proceed.   I discussed the assessment and treatment plan with the patient. The patient was provided an opportunity to ask questions and all were answered. The patient agreed with the plan and demonstrated an understanding of the instructions.    Integrated Behavioral Health Follow Apolinar Carrier  MRN: [id number] Name: Joan Moreno  Number of Integrated Behavioral Health Clinician visits: 5-Fifth Visit  Session Start time: 1300   Session End time: 1330  Total time in minutes: 30  Encounter Diagnoses  Name Primary?   MDD (major depressive disorder), recurrent episode, mild Yes   Generalized anxiety disorder     Types of Service: Video visit and Collaborative care   Subjective: Joan Moreno is a 34 y.o. female with history of a anxiety.en in consultation at the request of Janna Ostwalt PA C for establishment of [facility] Collaborative Care management. Pt is currently taking the following psychiatric medications: Lexapro  10.  Pt PCP wants to wait until pt next follow up to see if Gabapentin is indicated.  Pt denies SI, HI, or AVH at time of session. Pt denies substance use     Duration of problem: ongoing; Severity of problem: moderate  Objective: Mood: Anxious and Depressed and Affect: Appropriate Risk of harm to self or others: No plan to harm self or others  Life Context:  Pt reports that she is compliant with her medication. Pt reports positive relationships with family members. Pt reports that she feels her panic  episodes have decreased since last session.   Sleep: Patient reporting improving quality and quantity of sleep on most nights during the week.    Patient and/or Family's Strengths/Protective Factors: Social connections and [place] supports in place (healthy food, safe environments, etc.)  Goals Addressed: Patient will:  Reduce symptoms of: anxiety and depression   Increase knowledge and/or ability of: coping skills, healthy habits, self-management skills, and stress reduction   Demonstrate ability to: Increase healthy adjustment to current life circumstances  Progress towards Goals: Ongoing  Interventions: Interventions utilized:  Solution-Focused Strategies, Mindfulness or Management Consultant, and Behavioral Activation Standardized Assessments completed: C-SSRS Short, GAD-7, and PHQ 9     10/26/2024    1:50 PM 10/05/2024    1:45 PM 09/10/2024    1:33 PM  Depression screen PHQ 2/9  Decreased Interest 2 1 1   Down, Depressed, Hopeless 0 0 1  PHQ - 2 Score 2 1 2   Altered sleeping 3 3 3   Tired, decreased energy 1 1 2   Change in appetite 1 1 2   Feeling bad or failure about yourself  0 0 0  Trouble concentrating 3 3 3   Moving slowly or fidgety/restless 3 3 3   Suicidal thoughts 0 0 0  PHQ-9 Score 13 12 15   Difficult doing work/chores  Somewhat difficult Very difficult       10/26/2024    1:54 PM 10/05/2024    1:50 PM 09/10/2024    1:34 PM 08/03/2024    3:12 PM  GAD 7 : Generalized Anxiety Score  Nervous, Anxious, on Edge 2  3  3  3   Control/stop worrying 0  2  2  1    Worry too much - different things 0  2  3  1    Trouble relaxing 3  3  3  1    Restless 1  2  3  2    Easily annoyed or irritable 1  3  3  3    Afraid - awful might happen 0  0  1  0   Total GAD 7 Score 7 15 18 11   Anxiety Difficulty  Very difficult Very difficult Somewhat difficult     Data saved with a previous flowsheet row definition    Flowsheet Row Integrated Behavioral Health from 11/09/2024 in [facility] Integrated Behavioral Health from 10/26/2024 in [facility] Integrated Behavioral Health from 10/05/2024 in [facility]  C-SSRS RISK CATEGORY No Risk No Risk No Risk    Patient and/or Family Response: Pt agreeable to start recommended medication   Patient Centered Plan: Patient is on the following Treatment Plan(s):  1. Recommend Lexapro  10 mg daily 3. Recommend gabapentin 100 mg BID PRN anxiety (pt not taking--PCP to reassess at next visit) 4. Labs and vitamin D levels needed 5. BH specialist to follow up.  Clinical Assessment/Diagnosis  Encounter Diagnoses  Name Primary?   MDD (major depressive disorder), recurrent episode, mild Yes   Generalized anxiety disorder     Assessment: Patient currently experiencing ongoing symptoms of depression and anxiety.  Patient states that she did not have any medication after IBH treatment plan session.  IBH clinician to send a message to primary care about medication recommendations.  Patient may benefit from [facility] supports, medication management of symptoms .  Plan: Follow up with behavioral health clinician on : 11/09/24 Behavioral recommendations:  Continue w/ Lexapro  10 Behavioral activation strategies CBT/Mindfulness strategies Referral(s): Integrated [employer] (In Clinic)  [place] R Evelisse Szalkowski, LCSW

## 2024-11-09 NOTE — Patient Instructions (Signed)
 Using Behavioral Activation to manage stress/depression symptoms     Identify/understand your own mood triggers.   Structure your day--get up around the same time, eat meals/snacks around the same time, go to bed around the same time.   Purposefully schedule self care time and time to complete tasks. This can include quiet time.  Stimulate your brain--go for a walk, text/call a friend or family member, if you are indoors--go outside (and vice versa), go for a drive, go to a store with bright colors and bright lights. Try to do things in a different way--drive to your favorite places using an alternative route, or instead of starting on the right side of the grocery store when shopping, start on the left side. You might feel a bit uncomfortable doing things outside of the comfort zone, but this is helping the brain create new neural pathways and is very healthy for brain/emotional health.   Physical movement based on your ability. If you can go for a walk, do stretches, even waving your hands to music can trigger feel-good endorphins in the brain and help release physical tension we all hold in our bodies.  Even 5 minutes can make a difference.   Be intentional about doing things that bring you joy (or used to bring you joy), and look for the things in every day that make you happy.  Seek those glimmers of joy each day.  Set a timer for 5 minutes for a harder task (ex. Laundry, washing dishes).  Allow yourself to work distraction-free for 5 minutes, then stop when the timer goes off. If you need a break, take a break. If you want to continue working then set another timer for whatever time you choose.   Limit or eliminate substance use including alcohol, marijuana, or recreational use of prescription medication.  Let in the light!! Open the window blinds, curtains and let natural light in. Even sitting near a window or sitting outside can boost your mood, especially in the wintertime when  there is less daylight.   If you take medication to manage symptoms, remember to take all medications as they are prescribed (please read all labels!!)    Things to envision for ourselves to to improve inspiration, motivation, and initiative :  improving physical wellness, focus on family relationships, focusing on our own mental/emotional well being, being a part of a bigger community, finding a hobby, being a part of something that fosters personal growth, engaging socially with others (even digitally!!!)      ANXIETY/PANIC EPISODE MANAGEMENT (CBT/MINDFULNESS BASED)   If you are in a highly stimulating or triggering environment, change your location to a less stimulating or safer environment .   Stimulate your senses by tasting/eating a sour candy (such as a lemon drop) or a strong flavored cough drop.  This triggers smell, taste, and touch since we  have had lots of nerve endings inside of your mouth.   Practice slow, controlled breathing to avoid hyperventilation.  Breathing into your nose for the count of 4 inside your head, holding your breath for the count of 4 inside your head, and exhale slowly counting to 8 inside your head.  This is called 4-4-8 breathing, or triangle breathing. (Controlled breathing). This helps manage panic, anxiety, anger, and tearfulness.  Additional grounding exercises include rubbing your hands softly together, or wiggling toes inside your shoes, pretending to grasp the floor with your toes.    Listen to calming music or sounds  Count backwards in your  head by twos or tens or recite multiplication tables in your head.  Visualize a calming, happy place and identify 5 explicit details about this place (color, temperature, smells, visual details, etc.)  Splash cold water on your face or hold an ice cube in your hand (alternate hands)  Clench and release muscle groups (hands, shoulders, facial muscles)  Engage in soothing activities to recover after  a stressful/anxious episode such as: drinking a warm beverage, sitting in your favorite comfortable location, positive physical contact with a pet or weighted blanket, using positive self talk/positive affirmations.   Download PTSD Coach app for your phone or tablet--its free and has lots of tips to help you manage panic episodes no matter where you are

## 2024-11-15 ENCOUNTER — Telehealth: Payer: Self-pay | Admitting: Podiatry

## 2024-11-15 NOTE — Telephone Encounter (Signed)
 [facility] in [facility] called pt Joan Moreno on VM for pt to call to schedule appt to [facility].

## 2024-11-20 ENCOUNTER — Ambulatory Visit (INDEPENDENT_AMBULATORY_CARE_PROVIDER_SITE_OTHER): Admitting: Podiatry

## 2024-11-20 DIAGNOSIS — M216X1 Other acquired deformities of right foot: Secondary | ICD-10-CM

## 2024-11-20 DIAGNOSIS — M216X2 Other acquired deformities of left foot: Secondary | ICD-10-CM

## 2024-11-20 NOTE — Progress Notes (Signed)
 Orthotics are dispensed are functioning well no acute complaints break-in period was discussed.

## 2024-11-21 ENCOUNTER — Other Ambulatory Visit: Payer: Self-pay | Admitting: Physician Assistant

## 2024-11-21 DIAGNOSIS — F419 Anxiety disorder, unspecified: Secondary | ICD-10-CM

## 2024-11-26 ENCOUNTER — Ambulatory Visit: Admitting: Physician Assistant

## 2024-11-26 DIAGNOSIS — R030 Elevated blood-pressure reading, without diagnosis of hypertension: Secondary | ICD-10-CM

## 2024-11-26 DIAGNOSIS — E890 Postprocedural hypothyroidism: Secondary | ICD-10-CM

## 2024-11-26 DIAGNOSIS — E66811 Obesity, class 1: Secondary | ICD-10-CM

## 2024-11-26 DIAGNOSIS — R5383 Other fatigue: Secondary | ICD-10-CM

## 2024-11-26 DIAGNOSIS — F419 Anxiety disorder, unspecified: Secondary | ICD-10-CM

## 2024-11-30 ENCOUNTER — Ambulatory Visit: Admitting: Licensed Clinical Social Worker
